# Patient Record
Sex: Male | Born: 1955
Health system: Southern US, Community
[De-identification: ages and names within clinical notes are randomized; demographics above are authoritative.]

## PROBLEM LIST (undated history)

## (undated) DIAGNOSIS — G809 Cerebral palsy, unspecified: Secondary | ICD-10-CM

## (undated) HISTORY — PX: COLONOSCOPY: SHX174

## (undated) HISTORY — PX: OTHER SURGICAL HISTORY: SHX169

---

## 2005-04-23 ENCOUNTER — Emergency Department (HOSPITAL_COMMUNITY): Admission: EM | Admit: 2005-04-23 | Discharge: 2005-04-23 | Payer: Self-pay | Admitting: *Deleted

## 2005-04-24 ENCOUNTER — Ambulatory Visit (HOSPITAL_COMMUNITY): Admission: RE | Admit: 2005-04-24 | Discharge: 2005-04-24 | Payer: Self-pay | Admitting: *Deleted

## 2008-07-11 ENCOUNTER — Ambulatory Visit (HOSPITAL_COMMUNITY): Admission: RE | Admit: 2008-07-11 | Discharge: 2008-07-11 | Payer: Self-pay | Admitting: General Surgery

## 2011-04-01 NOTE — H&P (Signed)
NAME:  Peter Levy, Peter Levy NO.:  [id number]   MEDICAL RECORD NO.:  [id number]          PATIENT TYPE:  AMB   LOCATION:  DAY                           FACILITY:  [facility]   PHYSICIAN:  Dalia Heading, M.D.  DATE OF BIRTH:  08-26-1956   DATE OF ADMISSION:  DATE OF DISCHARGE:  LH                              HISTORY & PHYSICAL   CHIEF COMPLAINT:  Need for screening colonoscopy.   HISTORY OF PRESENT ILLNESS:  The patient is a 55 year old black male who  was referred for endoscopic evaluation.  He needs colonoscopy for  screening herpes.  No abdominal pain, weight loss, nausea, vomiting,  diarrhea, constipation, melena, and hematochezia have been noted.  He  has never had a colonoscopy.   FAMILY HISTORY:  He has no family history of colon carcinoma.   PAST MEDICAL HISTORY:  Unremarkable.   PAST SURGICAL HISTORY:  Unremarkable.   CURRENT MEDICATIONS:  None.   ALLERGIES:  No known drug allergies.   REVIEW OF SYSTEMS:  Noncontributory.   On physical examination, the patient is well-developed well-nourished  black male in no acute distress.  His lungs clear to auscultation with  good breath sounds bilaterally.  Heart examination reveals regular rate  and rhythm without S3, S4, murmurs.  The abdomen is soft, nontender, and  nondistended.  No hepatosplenomegaly or masses were noted.  Rectal  examination was deferred at the procedure.   IMPRESSION:  Need for screening colonoscopy.   PLAN:  The patient is scheduled for a colonoscopy on July 11, 2008.  The risk and benefits of the procedure including bleeding and  perforation were fully explained to the patient, gave informed consent.      Dalia Heading, M.D.  Electronically Signed     MAJ/MEDQ  D:  07/06/2008  T:  07/07/2008  Job:  [id number]   cc:   Kirk Ruths, M.D.  Fax: [phone]

## 2011-11-04 ENCOUNTER — Other Ambulatory Visit: Payer: Self-pay

## 2011-11-04 ENCOUNTER — Emergency Department (HOSPITAL_COMMUNITY): Payer: Medicare Other

## 2011-11-04 ENCOUNTER — Emergency Department (HOSPITAL_COMMUNITY)
Admission: EM | Admit: 2011-11-04 | Discharge: 2011-11-04 | Disposition: A | Discharge status: Home / Self Care | Payer: Medicare Other | Attending: Emergency Medicine | Admitting: Emergency Medicine

## 2011-11-04 DIAGNOSIS — G809 Cerebral palsy, unspecified: Secondary | ICD-10-CM | POA: Insufficient documentation

## 2011-11-04 DIAGNOSIS — R6889 Other general symptoms and signs: Secondary | ICD-10-CM | POA: Insufficient documentation

## 2011-11-04 DIAGNOSIS — J069 Acute upper respiratory infection, unspecified: Secondary | ICD-10-CM

## 2011-11-04 DIAGNOSIS — R059 Cough, unspecified: Secondary | ICD-10-CM | POA: Insufficient documentation

## 2011-11-04 DIAGNOSIS — R05 Cough: Secondary | ICD-10-CM | POA: Insufficient documentation

## 2011-11-04 HISTORY — DX: Cerebral palsy, unspecified: G80.9

## 2011-11-04 LAB — CBC
HCT: 41.2 % (ref 39.0–52.0)
Hemoglobin: 13.6 g/dL (ref 13.0–17.0)
MCH: 30 pg (ref 26.0–34.0)
MCV: 90.7 fL (ref 78.0–100.0)
Platelets: 207 10*3/uL (ref 150–400)

## 2011-11-04 LAB — BASIC METABOLIC PANEL
BUN: 15 mg/dL (ref 6–23)
CO2: 28 mEq/L (ref 19–32)
Calcium: 9.6 mg/dL (ref 8.4–10.5)
Chloride: 102 mEq/L (ref 96–112)
Creatinine, Ser: 1.18 mg/dL (ref 0.50–1.35)
GFR calc Af Amer: 78 mL/min — ABNORMAL LOW (ref >90)
GFR calc non Af Amer: 67 mL/min — ABNORMAL LOW (ref >90)
Glucose, Bld: 100 mg/dL — ABNORMAL HIGH (ref 70–99)

## 2011-11-04 LAB — URINALYSIS, ROUTINE W REFLEX MICROSCOPIC
Bilirubin Urine: NEGATIVE
Glucose, UA: NEGATIVE mg/dL
Hgb urine dipstick: NEGATIVE
Ketones, ur: NEGATIVE mg/dL
Leukocytes, UA: NEGATIVE
Nitrite: NEGATIVE
Protein, ur: NEGATIVE mg/dL
Specific Gravity, Urine: 1.025 (ref 1.005–1.030)
Urobilinogen, UA: 0.2 mg/dL (ref 0.0–1.0)
pH: 6.5 (ref 5.0–8.0)

## 2011-11-04 LAB — DIFFERENTIAL
Eosinophils Absolute: 0.1 10*3/uL (ref 0.0–0.7)
Lymphocytes Relative: 20 % (ref 12–46)
Lymphs Abs: 0.9 10*3/uL (ref 0.7–4.0)
Monocytes Relative: 12 % (ref 3–12)
Neutro Abs: 3 10*3/uL (ref 1.7–7.7)

## 2011-11-04 LAB — TROPONIN I: Troponin I: <0.3 ng/mL

## 2011-11-04 MED ORDER — ACETAMINOPHEN 325 MG PO TABS: 650.0000 mg | ORAL_TABLET | Freq: Four times a day (QID) | ORAL | Status: AC | PRN

## 2011-11-04 MED ORDER — ACETAMINOPHEN 500 MG PO TABS
1000.0000 mg | ORAL_TABLET | Freq: Once | ORAL | Status: AC
  Administered 2011-11-04: 1000 mg via ORAL
  Filled 2011-11-04 (×2): qty 2

## 2011-11-04 NOTE — ED Provider Notes (Signed)
History     CSN: [id number] Arrival date & time: 11/04/2011  4:32 PM   Chief Complaint  Patient presents with  . Cough    Patient is a 54 y.o. male presenting with cough. The history is provided by a caregiver. The history is limited by a developmental delay.  Cough  Pt was seen at 1730.  Per pt's caregiver, c/o gradual onset and persistence of constant cough since last night.  Cough has been assoc with vague chest "pains" since this morning.  Adult day program told caregiver pt "felt warm" today, unk fevers previous to today.  Pt himself denies any other complaints than "cough."  +multiple sick contacts at adult daycare site and group home with similar symptoms.  No reported N/V/D, no rash, no SOB.   Past Medical History  Diagnosis Date  . CP (cerebral palsy)     History reviewed. No pertinent past surgical history.   History  Substance Use Topics  . Smoking status: Never Smoker   . Smokeless tobacco: Not on file  . Alcohol Use: No    Review of Systems  Unable to perform ROS: Other  Respiratory: Positive for cough.     Allergies  Review of patient's allergies indicates no known allergies.  Home Medications  No current outpatient prescriptions on file.  BP 120/65  Pulse 107  Temp(Src) 102.6 F (39.2 C) (Oral)  Resp 20  Wt 154 lb (69.854 kg)  SpO2 96%  Physical Exam 1735: Physical examination:  Nursing notes reviewed; Vital signs and O2 SAT reviewed;  Constitutional: Well developed, Well nourished, Well hydrated, In no acute distress; Head:  Normocephalic, atraumatic; Eyes: EOMI, PERRL, No scleral icterus; ENMT: Mouth and pharynx normal, Mucous membranes moist, +edemetous nasal turbinates bilat with clear rhinorrhea.; Neck: Supple, Full range of motion, No lymphadenopathy; Cardiovascular: Tachycardic rate and regular rhythm, No murmur or gallop; Respiratory: Breath sounds clear & equal bilaterally, No rales, rhonchi, wheezes, or rub, Normal respiratory  effort/excursion; Chest: Nontender, Movement normal; Abdomen: Soft, Nontender, Nondistended, Normal bowel sounds; Extremities: Pulses normal, No tenderness, No edema, No calf edema or asymmetry.; Neuro: Awake/alert, eyes open spontaneously, speech clear, confused re: events, Major CN grossly intact.  No gross focal motor or sensory deficits in extremities.; Skin: Color normal, Warm, Dry, no rash.    ED Course  Procedures    MDM  MDM Reviewed: nursing note and vitals Interpretation: labs, x-ray and ECG    Date: 11/04/2011  Rate: 99  Rhythm: normal sinus rhythm and premature ventricular contractions (PVC)  QRS Axis: normal  Intervals: normal  ST/T Wave abnormalities: normal  Conduction Disutrbances:none  Narrative Interpretation:   Old EKG Reviewed: none available.  Results for orders placed during the hospital encounter of 11/04/11  CBC      Component Value Range   WBC 4.6  4.0 - 10.5 (K/uL)   RBC 4.54  4.22 - 5.81 (MIL/uL)   Hemoglobin 13.6  13.0 - 17.0 (g/dL)   HCT 40.9  81.1 - 91.4 (%)   MCV 90.7  78.0 - 100.0 (fL)   MCH 30.0  26.0 - 34.0 (pg)   MCHC 33.0  30.0 - 36.0 (g/dL)   RDW 78.2  95.6 - 21.3 (%)   Platelets 207  150 - 400 (K/uL)  DIFFERENTIAL      Component Value Range   Neutrophils Relative 66  43 - 77 (%)   Neutro Abs 3.0  1.7 - 7.7 (K/uL)   Lymphocytes Relative 20  12 - 46 (%)  Lymphs Abs 0.9  0.7 - 4.0 (K/uL)   Monocytes Relative 12  3 - 12 (%)   Monocytes Absolute 0.6  0.1 - 1.0 (K/uL)   Eosinophils Relative 2  0 - 5 (%)   Eosinophils Absolute 0.1  0.0 - 0.7 (K/uL)   Basophils Relative 0  0 - 1 (%)   Basophils Absolute 0.0  0.0 - 0.1 (K/uL)  BASIC METABOLIC PANEL      Component Value Range   Sodium 137  135 - 145 (mEq/L)   Potassium 3.9  3.5 - 5.1 (mEq/L)   Chloride 102  96 - 112 (mEq/L)   CO2 28  19 - 32 (mEq/L)   Glucose, Bld 100 (*) 70 - 99 (mg/dL)   BUN 15  6 - 23 (mg/dL)   Creatinine, Ser 1.61  0.50 - 1.35 (mg/dL)   Calcium 9.6  8.4 - 09.6  (mg/dL)   GFR calc non Af Amer 67 (*) >90 (mL/min)   GFR calc Af Amer 78 (*) >90 (mL/min)  URINALYSIS, ROUTINE W REFLEX MICROSCOPIC      Component Value Range   Color, Urine YELLOW  YELLOW    APPearance CLEAR  CLEAR    Specific Gravity, Urine 1.025  1.005 - 1.030    pH 6.5  5.0 - 8.0    Glucose, UA NEGATIVE  NEGATIVE (mg/dL)   Hgb urine dipstick NEGATIVE  NEGATIVE    Bilirubin Urine NEGATIVE  NEGATIVE    Ketones, ur NEGATIVE  NEGATIVE (mg/dL)   Protein, ur NEGATIVE  NEGATIVE (mg/dL)   Urobilinogen, UA 0.2  0.0 - 1.0 (mg/dL)   Nitrite NEGATIVE  NEGATIVE    Leukocytes, UA NEGATIVE  NEGATIVE   TROPONIN I      Component Value Range   Troponin I <0.30  <0.30 (ng/mL)   Dg Chest 2 View  11/04/2011  *RADIOLOGY REPORT*  Clinical Data: Fever.  Cough.  Congestion.  Cerebral palsy.  CHEST - 2 VIEW  Comparison: None.  Findings: Lateral view degraded by patient arm position.  Midline trachea.  Normal heart size and mediastinal contours. No pleural effusion or pneumothorax.  Clear lungs.  Mild left hemidiaphragm elevation.  IMPRESSION: No acute cardiopulmonary disease.  Original Report Authenticated By: Consuello Bossier, M.D.    8:46 PM:   Pt smiling, eating sandwich and drinking soda.  NAD, resps easy.  Fever improved with APAP.   Appears viral illness at this time, will tx symptomatically.  Dx testing d/w pt's caregiver.  Questions answered.  Verb understanding, agreeable to d/c home with outpt f/u.          Peter Levy Peter Quarry, DO 11/05/11 1202

## 2011-11-04 NOTE — ED Notes (Signed)
Pt reports cough since last night and sudden onset of cp today.  Pain worse with coughing per ems.

## 2011-11-04 NOTE — ED Notes (Signed)
Patient medicated for fever with 1 gram of Tylenol.

## 2011-11-05 LAB — URINE CULTURE
Colony Count: NO GROWTH
Culture  Setup Time: 201212190219
Culture: NO GROWTH

## 2012-05-14 DIAGNOSIS — G803 Athetoid cerebral palsy: Secondary | ICD-10-CM | POA: Diagnosis not present

## 2012-05-14 DIAGNOSIS — F73 Profound intellectual disabilities: Secondary | ICD-10-CM | POA: Diagnosis not present

## 2012-05-14 DIAGNOSIS — Z6832 Body mass index (BMI) 32.0-32.9, adult: Secondary | ICD-10-CM | POA: Diagnosis not present

## 2012-05-14 DIAGNOSIS — Z23 Encounter for immunization: Secondary | ICD-10-CM | POA: Diagnosis not present

## 2012-08-26 DIAGNOSIS — Z23 Encounter for immunization: Secondary | ICD-10-CM | POA: Diagnosis not present

## 2013-05-03 DIAGNOSIS — G809 Cerebral palsy, unspecified: Secondary | ICD-10-CM | POA: Diagnosis not present

## 2013-05-03 DIAGNOSIS — Z125 Encounter for screening for malignant neoplasm of prostate: Secondary | ICD-10-CM | POA: Diagnosis not present

## 2013-05-03 DIAGNOSIS — Z79899 Other long term (current) drug therapy: Secondary | ICD-10-CM | POA: Diagnosis not present

## 2013-05-03 DIAGNOSIS — Z7182 Exercise counseling: Secondary | ICD-10-CM | POA: Diagnosis not present

## 2013-05-03 DIAGNOSIS — Z681 Body mass index (BMI) 19 or less, adult: Secondary | ICD-10-CM | POA: Diagnosis not present

## 2013-05-03 DIAGNOSIS — Z713 Dietary counseling and surveillance: Secondary | ICD-10-CM | POA: Diagnosis not present

## 2013-05-03 DIAGNOSIS — A938 Other specified arthropod-borne viral fevers: Secondary | ICD-10-CM | POA: Diagnosis not present

## 2013-05-03 DIAGNOSIS — E669 Obesity, unspecified: Secondary | ICD-10-CM | POA: Diagnosis not present

## 2013-08-17 DIAGNOSIS — Z23 Encounter for immunization: Secondary | ICD-10-CM | POA: Diagnosis not present

## 2014-05-05 DIAGNOSIS — Z0000 Encounter for general adult medical examination without abnormal findings: Secondary | ICD-10-CM | POA: Diagnosis not present

## 2014-05-05 DIAGNOSIS — R972 Elevated prostate specific antigen [PSA]: Secondary | ICD-10-CM | POA: Diagnosis not present

## 2014-05-05 DIAGNOSIS — Z6830 Body mass index (BMI) 30.0-30.9, adult: Secondary | ICD-10-CM | POA: Diagnosis not present

## 2014-05-05 DIAGNOSIS — D72818 Other decreased white blood cell count: Secondary | ICD-10-CM | POA: Diagnosis not present

## 2014-08-21 DIAGNOSIS — Z23 Encounter for immunization: Secondary | ICD-10-CM | POA: Diagnosis not present

## 2015-03-04 ENCOUNTER — Emergency Department (HOSPITAL_COMMUNITY): Payer: Medicare Other

## 2015-03-04 ENCOUNTER — Encounter (HOSPITAL_COMMUNITY): Payer: Self-pay | Admitting: Emergency Medicine

## 2015-03-04 ENCOUNTER — Emergency Department (HOSPITAL_COMMUNITY)
Admission: EM | Admit: 2015-03-04 | Discharge: 2015-03-04 | Disposition: A | Discharge status: Home / Self Care | Payer: Medicare Other | Attending: Emergency Medicine | Admitting: Emergency Medicine

## 2015-03-04 DIAGNOSIS — M542 Cervicalgia: Secondary | ICD-10-CM

## 2015-03-04 DIAGNOSIS — M5032 Other cervical disc degeneration, mid-cervical region: Secondary | ICD-10-CM | POA: Diagnosis not present

## 2015-03-04 DIAGNOSIS — M62838 Other muscle spasm: Secondary | ICD-10-CM

## 2015-03-04 MED ORDER — METHOCARBAMOL 500 MG PO TABS: 500.0000 mg | ORAL_TABLET | Freq: Two times a day (BID) | ORAL | Status: DC | PRN

## 2015-03-04 MED ORDER — DIAZEPAM 2 MG PO TABS
2.0000 mg | ORAL_TABLET | Freq: Once | ORAL | Status: AC
  Administered 2015-03-04: 2 mg via ORAL
  Filled 2015-03-04: qty 1

## 2015-03-04 NOTE — Discharge Instructions (Signed)
°Emergency Department Resource Guide °1) Find a Doctor and Pay Out of Pocket °Although you won't have to find out who is covered by your insurance plan, it is a good idea to ask around and get recommendations. You will then need to call the office and see if the doctor you have chosen will accept you as a new patient and what types of options they offer for patients who are self-pay. Some doctors offer discounts or will set up payment plans for their patients who do not have insurance, but you will need to ask so you aren't surprised when you get to your appointment. ° °2) Contact Your Local Health Department °Not all health departments have doctors that can see patients for sick visits, but many do, so it is worth a call to see if yours does. If you don't know where your local health department is, you can check in your phone book. The CDC also has a tool to help you locate your state's health department, and many state websites also have listings of all of their local health departments. ° °3) Find a Walk-in Clinic °If your illness is not likely to be very severe or complicated, you may want to try a walk in clinic. These are popping up all over the country in pharmacies, drugstores, and shopping centers. They're usually staffed by nurse practitioners or physician assistants that have been trained to treat common illnesses and complaints. They're usually fairly quick and inexpensive. However, if you have serious medical issues or chronic medical problems, these are probably not your best option. ° °No Primary Care Doctor: °- Call Health Connect at  832-8000 - they can help you locate a primary care doctor that  accepts your insurance, provides certain services, etc. °- Physician Referral Service- 1-800-533-3463 ° °Chronic Pain Problems: °Organization         Address  Phone   Notes  °[facility]  (336) [phone] Patients need to be referred by their primary care doctor.  ° °Medication  Assistance: °Organization         Address  Phone   Notes  °[place] [facility] [address] °[place], [place] [address] (336) 641-8030 --Must be a resident of [place] °-- Must have NO insurance coverage whatsoever (no Medicaid/ Medicare, etc.) °-- The pt. MUST have a primary care doctor that directs their care regularly and follows them in the community °  °[facility]  (866) 331-1348   °[employer]  (888) 892-1162   ° °Agencies that provide inexpensive medical care: °Organization         Address  Phone   Notes  °[facility]  (336) 832-8035   °[facility]    (336) 832-7272   °[facility] [address] °[place], [place] [address] (336) 832-4777   °[facility] [place] [facility] [address], °[place] (336) 271-4999   °[facility]    (336) 373-0678   °[facility]    (336) 272-1050   °[facility] ° [address], [place] Phone:  (336) 832-4444, Fax:  (336) 832-4440 Hours of Operation:  9 am - 6 pm, M-F.  Also accepts Medicaid/Medicare and self-pay.  °[facility] ° [address], [place] Phone: (336) [phone], Fax: (336) [phone]. Hours of Operation:  8:30 am - 5:30 pm, M-F.  Also accepts Medicaid and self-pay.  °[facility] [place] [address], [place] Phone: (336) 878-6027   °[facility] [address], [place], [place] (336)723-1848, Ext. 123 Mondays & Thursdays: 7-9 AM.  First 15 patients are seen on a first come, first serve basis. °  ° °Medicaid-accepting [place] Providers: ° °Organization         Address  Phone   Notes  °[facility] [address], [place] (336) 641-2100 Also accepts self-pay patients.  °[facility] [address], [place] ° (336) 856-9996   °[facility] [address], [place]  (336) 288-8857   °[facility] [address] [place] [address], [place] (336) 299-7000   °Veita Bland [address], [place]  ° (336) 373-1557 Only accepts [place] Access Medicaid patients after they have their name applied to their card.  ° °Self-Pay (no insurance) in [place]: ° °Organization         Address  Phone   Notes  °Sickle Cell Patients, [facility] [address], [place] (336) 832-1970   °[facility] [address], [place] (336) 832-4400   °[facility] ° [address], [place] (336) 992-4800   °[facility]/Dr. Osei-Bonsu ° [address] [place] [address], [place] or [address], [place] (336) [phone] Phone number for both [place] and [place] locations is the same.  °[facility] [address], [place] (336) 299-0000   °[facility] [address] [place] [address], [place] or [address] (336) 852-7530 °(336) 878-2260   °[facility] [address], [place] (336) 350-1642, phone; (336) 294-5005, fax Sees patients 1st and 3rd Saturday of every month.  Must not qualify for public or private insurance (i.e. Medicaid, Medicare, [place] Health Choice, Veterans' Benefits) • Household income should be no more than 200% of the poverty level •The clinic cannot treat you if you are pregnant or think you are pregnant • Sexually transmitted diseases are not treated at the clinic.  ° ° °Dental Care: °Organization         Address  Phone  Notes  °[place] [facility] [address], [place] (336) 641-6152 Accepts children up to age 21 who are enrolled in Medicaid or [place] Health Choice; pregnant women with a Medicaid card; and children who have applied for Medicaid or [place] Health Choice, but were declined, whose parents can pay a reduced fee at time of service.  °[place]  [facility] [place]  [address], [place] (336) 641-7733 Accepts children up to age 21 who are enrolled in Medicaid or [place] Health Choice; pregnant women with a Medicaid card; and children who have applied for Medicaid or [place] Health Choice, but were declined, whose parents can pay a reduced fee at time of service.  °[facility] ° [address], [place] (336) 641-4533 Patients are seen by appointment only. Walk-ins are not accepted. [facility] will see patients 18 years of age and older. °Monday - Tuesday (8am-5pm) °Most Wednesdays (8:30-5pm) °$30 per visit, cash only  °[facility] ° [address], [place] (336) 641-4533 Patients are seen by appointment only. Walk-ins are not accepted. [facility] will see patients 18 years of age and older. °One   Wednesday Evening (Monthly: Volunteer Based).  $30 per visit, cash only  °[facility]  (919) [phone] for adults; Children under age 4, call Graduate Pediatric Dentistry at ([phone]) [phone]. Children aged 4-14, please call ([phone]) [phone] to request a pediatric application. ° Dental services are provided in all areas of dental care including fillings, crowns and bridges, complete and partial dentures, implants, gum treatment, root canals, and extractions. Preventive care is also provided. Treatment is provided to both adults and children. °Patients are selected via a lottery and there is often a waiting list. °  °[facility] [address], °[place] ° (336) 763-8833 [email or web address] °  °[facility] [address], [place], [place] (336)723-1848, Ext. 123 Second and Fourth Thursday of each month, opens at 6:30 AM; Clinic ends at 9 AM.  Patients are seen on a first-come first-served basis, and a limited number are seen during each clinic.  ° °[facility] ° [address], [place], [place] (336) 723-7904    Eligibility Requirements °You must have lived in [place], [place], or [place] counties for at least the last three months. °  You cannot be eligible for state or federal sponsored healthcare insurance, including Veterans Administration, Medicaid, or Medicare. °  You generally cannot be eligible for healthcare insurance through your employer.  °  How to apply: °Eligibility screenings are held every Tuesday and Wednesday afternoon from 1:00 pm until 4:00 pm. You do not need an appointment for the interview!  °[facility] [address], [place], [place] 336-631-2330   °[facility]  336-342-8273   °[place]  336-703-3100   °[facility]  336-570-6415   ° °Behavioral Health Resources in the Community: °Intensive Outpatient Programs °Organization         Address  Phone  Notes  °[facility] [address], [place], [place] 336-878-6098   °[facility] [address], [place], [place] 336-832-9800   °[facility] [address], [place], [place] ° 336-882-2125   °[facility] [address],  °[place], [place] 1-800-853-5163 or 336-641-4981   °Substance Abuse Resources °Organization         Address  Phone  Notes  °[facility]  336-882-2125   °[facility]  336-784-9470   °[facility]  336-285-9073   °[facility]  336-845-3988   °Residential & Outpatient Substance Abuse Program  1-800-659-3381   °Psychological Services °Organization         Address  Phone  Notes  °[facility]  336- 832-9600   °[facility]  336- 378-7881   °[facility] [address], [place] 1-800-853-5163 or 336-641-4981   ° °Mobile Crisis Teams °Organization         Address  Phone  Notes  °[facility], Mobile Crisis Care Unit  1-877-626-1772   °Assertive °[facility] ° [address]  [place], [place] 336-834-9664   °Sharon DeEsch [address] °[place] 336-554-5454   ° °Self-Help/Support Groups °Organization         Address  Phone             Notes  °[facility] of  - variety of support groups  336- 373-1402 Call for more information  °Narcotics Anonymous (NA), [facility] [address], °[place]  2 meetings at this location  ° °  Residential Treatment Programs Organization         Address  Phone  Notes  [facility] [address],    [place]  [phone]   [facility]  [address], [place] [phone], [place], [place] [phone]   [facility] [address], [place] [phone] Admissions: 8am-3pm M-F  [facility] [address]    [place], [place] [phone]   [facility] [address], [place], [place] [phone]   [facility] [address]  [place], [place] [phone]   [facility] Intensive Outpatient [address], [place], [place] [phone]   [facility] [address]  [place], [place] [phone] or [phone]   [facility] [address] [place], [place] [phone] Accepts Medicaid  [facility] [address]  [place] [phone] Substance Abuse/Addiction Treatment   [facility] Organization         Address  Phone  Notes  [facility]  [phone](888) [phone]   Angie FavaJulie Brannon, PhD [address], [address] [place], [place]   [phone](336) [phone] or [phone](336) [phone]   [facility]   [address] [place], [place] ([phone](336) [phone]   [facility] [address], [place], [place] [phone](336) [phone] Insurance/Medicaid/sponsorship through [facility] [address]                                    [place], [place] [phone](336) [phone] Therapy/tele-psych/case    [facility] [address]   [place], [place] [phone]) [phone]    Dr. Lolly MustacheArfeen  [phone](336) [phone]   [facility] [place]  [employer] [facility] 1) [address], [place] 2) [address], [place] 3)  [address], [place] [phone](336) [phone](336) [phone]  [phone](336) [phone]   [facility] Child Abuse Hotline 252-249-3938(336) 212-721-7251 or [phone](336) [phone] (After Hours)      Take the prescription as directed. May also take over the counter tylenol, as directed on packaging, as needed for discomfort. Apply moist heat or ice to the area(s) of discomfort, for 15 minutes at a time, several times per day for the next few days.  Do not fall asleep on a heating or ice pack.  Call your regular medical doctor tomorrow to schedule a follow up appointment in the next 2 to 3 days.  Return to the Emergency Department immediately if worsening.

## 2015-03-04 NOTE — ED Provider Notes (Signed)
CSN: [id number]     Arrival date & time 03/04/15  [id number] History   First MD Initiated Contact with Patient 03/04/15 [phone]     Chief Complaint  Patient presents with  . Neck Pain      HPI Pt was seen at 0710.  Per pt, c/o gradual onset and persistence of left sided neck "pain" that began "a while ago." Pt's caregiver states she "just noticed it." Pt describes the pain as "a cramp in my muscle." Denies injury, no focal motor weakness, no tingling/numbness in extremities, no CP/SOB, no fevers, no rash.    Past Medical History  Diagnosis Date  . CP (cerebral palsy)    History reviewed. No pertinent past surgical history.  History  Substance Use Topics  . Smoking status: Never Smoker   . Smokeless tobacco: Not on file  . Alcohol Use: No    Review of Systems ROS: Statement: All systems negative except as marked or noted in the HPI; Constitutional: Negative for fever and chills. ; ; Eyes: Negative for eye pain, redness and discharge. ; ; ENMT: Negative for ear pain, hoarseness, nasal congestion, sinus pressure and sore throat. ; ; Cardiovascular: Negative for chest pain, palpitations, diaphoresis, dyspnea and peripheral edema. ; ; Respiratory: Negative for cough, wheezing and stridor. ; ; Gastrointestinal: Negative for nausea, vomiting, diarrhea, abdominal pain, blood in stool, hematemesis, jaundice and rectal bleeding. . ; ; Genitourinary: Negative for dysuria, flank pain and hematuria. ; ; Musculoskeletal: +left neck pain. Negative for back pain. Negative for swelling and trauma.; ; Skin: Negative for pruritus, rash, abrasions, blisters, bruising and skin lesion.; ; Neuro: Negative for headache, lightheadedness and neck stiffness. Negative for weakness, altered level of consciousness , altered mental status, extremity weakness, paresthesias, involuntary movement, seizure and syncope.      Allergies  Review of patient's allergies indicates no known allergies.  Home Medications   Prior to  Admission medications   Not on File   BP 132/92 mmHg  Pulse 125  Temp(Src) 98.4 F (36.9 C) (Oral)  Resp 18  Ht 5\' 4"  (1.626 m)  Wt 184 lb (83.462 kg)  BMI 31.57 kg/m2  SpO2 98% Physical Exam  0715: Physical examination:  Nursing notes reviewed; Vital signs and O2 SAT reviewed;  Constitutional: Well developed, Well nourished, Well hydrated, In no acute distress; Head:  Normocephalic, atraumatic; Eyes: EOMI, PERRL, No scleral icterus; ENMT: Mouth and pharynx normal, Mucous membranes moist; Neck: Supple, Full range of motion, No lymphadenopathy; Cardiovascular: Regular rate and rhythm, No gallop; Respiratory: Breath sounds clear & equal bilaterally, No wheezes.  Speaking full sentences with ease, Normal respiratory effort/excursion; Chest: Nontender, Movement normal; Abdomen: Soft, Nontender, Nondistended, Normal bowel sounds; Genitourinary: No CVA tenderness; Spine:  No midline CS, TS, LS tenderness. +TTP left hypertonic trapezius muscle. No rash.;; Extremities: Pulses normal, No tenderness, No edema, No calf edema or asymmetry.; Neuro: AA&Ox3, Major CN grossly intact.  Speech clear. +hx CP, moves all extremities spontaneously and to command per his baseline..; Skin: Color normal, Warm, Dry.   ED Course  Procedures     EKG Interpretation None      MDM  MDM Reviewed: previous chart, nursing note and vitals Interpretation: CT scan   Ct Cervical Spine Wo Contrast 03/04/2015   CLINICAL DATA:  Left side neck pain, no known injury. Caregiver said pain began this am. Pt has cerebral palsy. Unable to straighten patient's head, pt had difficulty controlling movements during exam  EXAM: CT CERVICAL SPINE WITHOUT CONTRAST  TECHNIQUE: Multidetector CT imaging of the cervical spine was performed without intravenous contrast. Multiplanar CT image reconstructions were also generated.  COMPARISON:  None.  FINDINGS: No fracture. No spondylolisthesis. Head is tilted to the right. There are degenerative  changes with loss of disc height endplate spurring most evident from C2-C3 through C5-C6. Uncovertebral spurring and some facet spurring causes neural foraminal narrowing. This is greatest on the left at C3-C4 where it is severe.  No evidence of a disc herniation.  Surrounding soft tissues are unremarkable.  Lung apices are clear.  IMPRESSION: 1. No fracture or acute findings. 2. Degenerative changes as described.   Electronically Signed   By: Amie Portland M.D.   On: 03/04/2015 07:55    2005:  CT CS with degenerative changes. Feels better after meds and wants to go home now. Tx symptomatically at this time. Dx and testing d/w pt and caregiver.  Questions answered.  Verb understanding, agreeable to d/c home with outpt f/u.   Samuel Jester, DO 03/05/15 2142

## 2015-03-04 NOTE — ED Notes (Signed)
Per pt he has been have neck pain for "a while" caretaker states she just noticed it about an hour ago. Pt denies any injury.

## 2015-03-07 DIAGNOSIS — G894 Chronic pain syndrome: Secondary | ICD-10-CM | POA: Diagnosis not present

## 2015-03-07 DIAGNOSIS — R252 Cramp and spasm: Secondary | ICD-10-CM | POA: Diagnosis not present

## 2015-03-07 DIAGNOSIS — E6609 Other obesity due to excess calories: Secondary | ICD-10-CM | POA: Diagnosis not present

## 2015-03-07 DIAGNOSIS — Z6830 Body mass index (BMI) 30.0-30.9, adult: Secondary | ICD-10-CM | POA: Diagnosis not present

## 2015-03-07 DIAGNOSIS — G809 Cerebral palsy, unspecified: Secondary | ICD-10-CM | POA: Diagnosis not present

## 2015-04-27 DIAGNOSIS — E6609 Other obesity due to excess calories: Secondary | ICD-10-CM | POA: Diagnosis not present

## 2015-04-27 DIAGNOSIS — E782 Mixed hyperlipidemia: Secondary | ICD-10-CM | POA: Diagnosis not present

## 2015-04-27 DIAGNOSIS — Z6830 Body mass index (BMI) 30.0-30.9, adult: Secondary | ICD-10-CM | POA: Diagnosis not present

## 2015-06-25 DIAGNOSIS — E782 Mixed hyperlipidemia: Secondary | ICD-10-CM | POA: Diagnosis not present

## 2015-06-25 DIAGNOSIS — Z6830 Body mass index (BMI) 30.0-30.9, adult: Secondary | ICD-10-CM | POA: Diagnosis not present

## 2015-06-25 DIAGNOSIS — E6609 Other obesity due to excess calories: Secondary | ICD-10-CM | POA: Diagnosis not present

## 2015-06-25 DIAGNOSIS — Z125 Encounter for screening for malignant neoplasm of prostate: Secondary | ICD-10-CM | POA: Diagnosis not present

## 2015-08-22 DIAGNOSIS — Z23 Encounter for immunization: Secondary | ICD-10-CM | POA: Diagnosis not present

## 2015-08-31 DIAGNOSIS — Z6830 Body mass index (BMI) 30.0-30.9, adult: Secondary | ICD-10-CM | POA: Diagnosis not present

## 2015-08-31 DIAGNOSIS — E6609 Other obesity due to excess calories: Secondary | ICD-10-CM | POA: Diagnosis not present

## 2015-08-31 DIAGNOSIS — Z1389 Encounter for screening for other disorder: Secondary | ICD-10-CM | POA: Diagnosis not present

## 2015-08-31 DIAGNOSIS — G809 Cerebral palsy, unspecified: Secondary | ICD-10-CM | POA: Diagnosis not present

## 2016-04-16 DIAGNOSIS — Z6828 Body mass index (BMI) 28.0-28.9, adult: Secondary | ICD-10-CM | POA: Diagnosis not present

## 2016-04-16 DIAGNOSIS — Z1389 Encounter for screening for other disorder: Secondary | ICD-10-CM | POA: Diagnosis not present

## 2016-04-16 DIAGNOSIS — G809 Cerebral palsy, unspecified: Secondary | ICD-10-CM | POA: Diagnosis not present

## 2016-04-16 DIAGNOSIS — E663 Overweight: Secondary | ICD-10-CM | POA: Diagnosis not present

## 2016-08-28 DIAGNOSIS — Z23 Encounter for immunization: Secondary | ICD-10-CM | POA: Diagnosis not present

## 2016-12-30 DIAGNOSIS — R4183 Borderline intellectual functioning: Secondary | ICD-10-CM | POA: Diagnosis not present

## 2017-01-01 DIAGNOSIS — R4183 Borderline intellectual functioning: Secondary | ICD-10-CM | POA: Diagnosis not present

## 2017-01-02 DIAGNOSIS — R4183 Borderline intellectual functioning: Secondary | ICD-10-CM | POA: Diagnosis not present

## 2017-01-12 IMAGING — CT CT CERVICAL SPINE W/O CM
3 of 4 series · 12 of 33 positions shown, 14 images · non-contrast
Comparison: None.

CLINICAL DATA: Left side neck pain, no known injury. Caregiver said
pain began this am. Pt has cerebral palsy. Unable to straighten
patient's head, pt had difficulty controlling movements during exam

EXAM:
CT CERVICAL SPINE WITHOUT CONTRAST
TECHNIQUE: Multidetector CT imaging of the cervical spine was performed without
intravenous contrast. Multiplanar CT image reconstructions were also
generated.

[Series 3: cervical 2.0 st axial · axial · 0.36mm/px · z∈[+93,+237]mm · 4 of 108 slices shown, 5 images]
[im 18/108  soft-tissue]
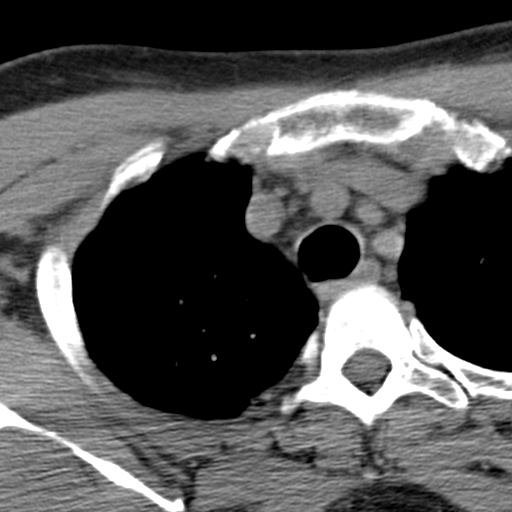
[im 18/108  bone]
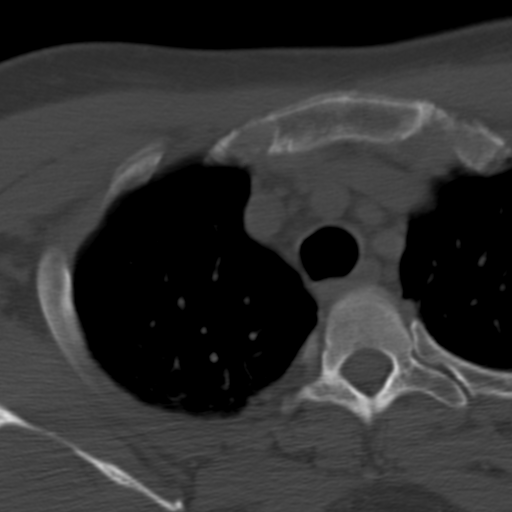
[im 36/108  bone]
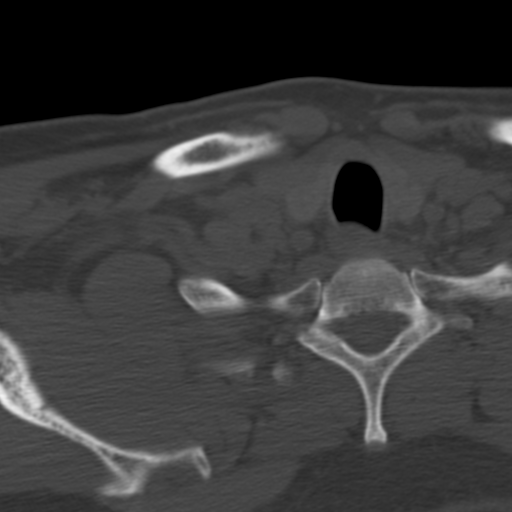
[im 72/108  bone]
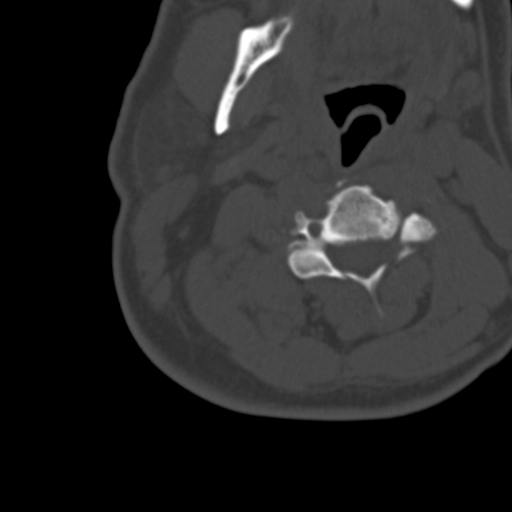
[im 90/108  bone]
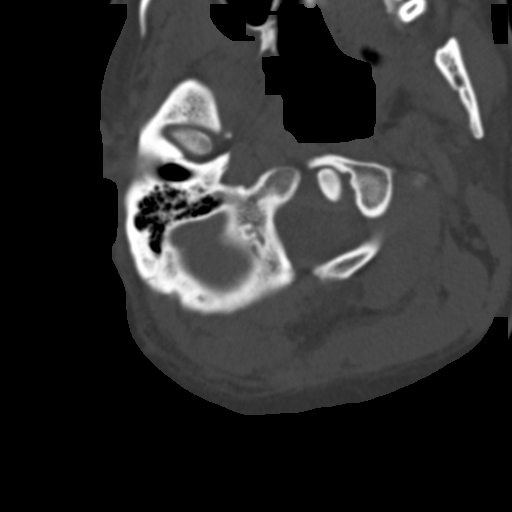

[Series 5: cervical spine sagittal bone · sagittal · 0.20mm/px · 5 of 65 slices shown, 6 images]
[im 22/65  bone]
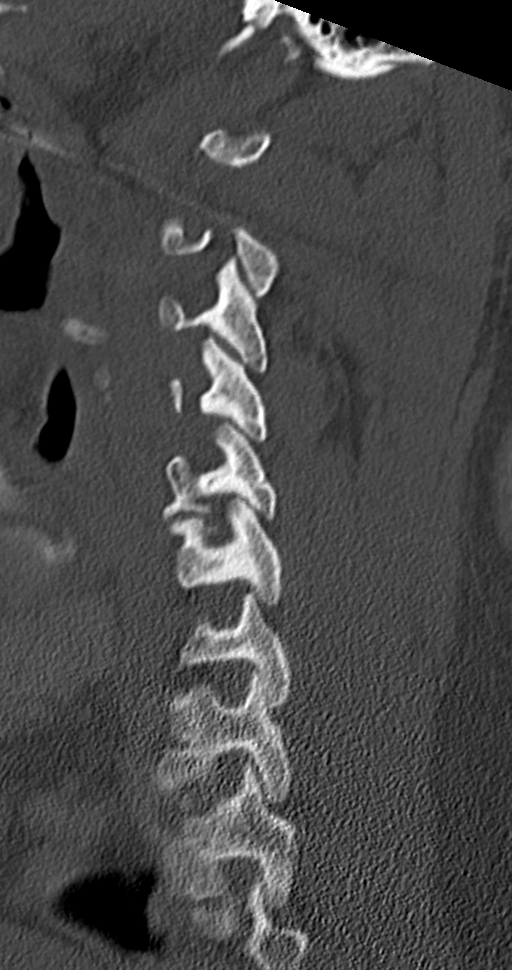
[im 27/65  bone]
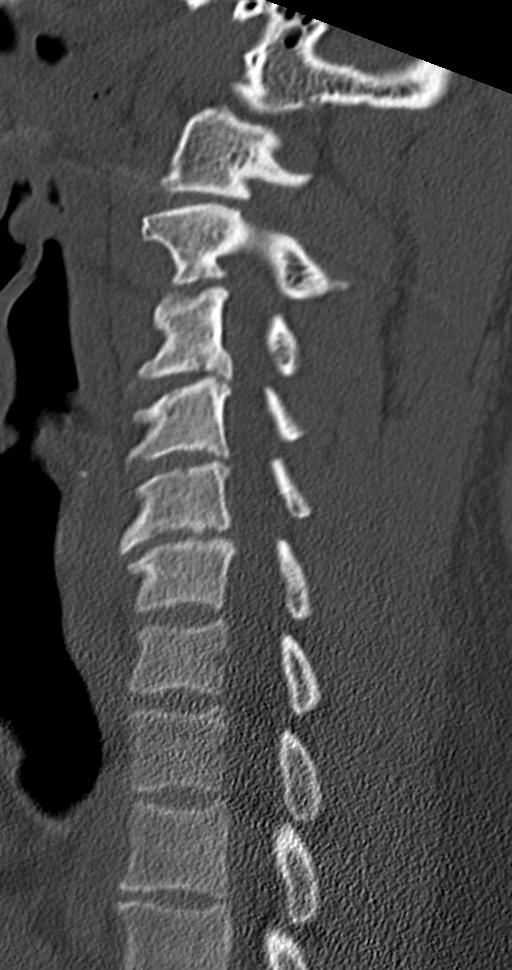
[im 33/65  soft-tissue]
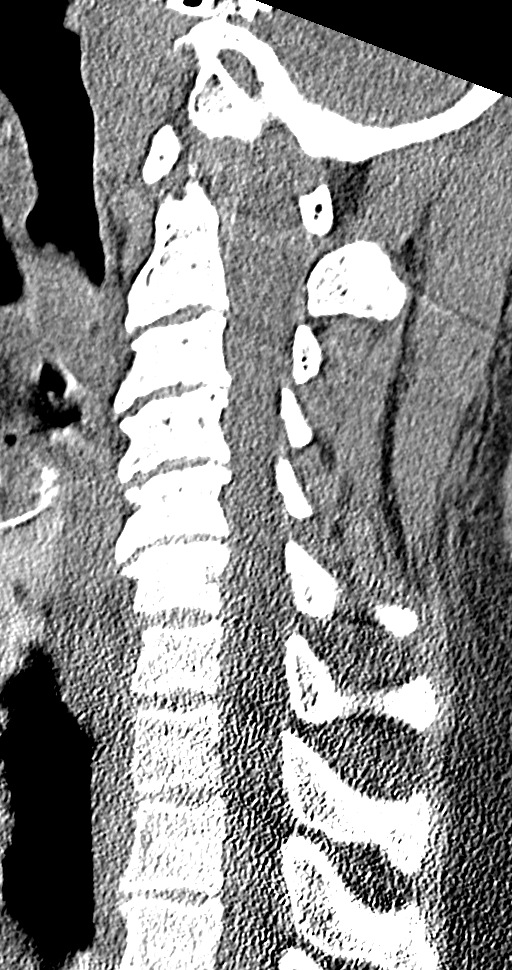
[im 33/65  bone]
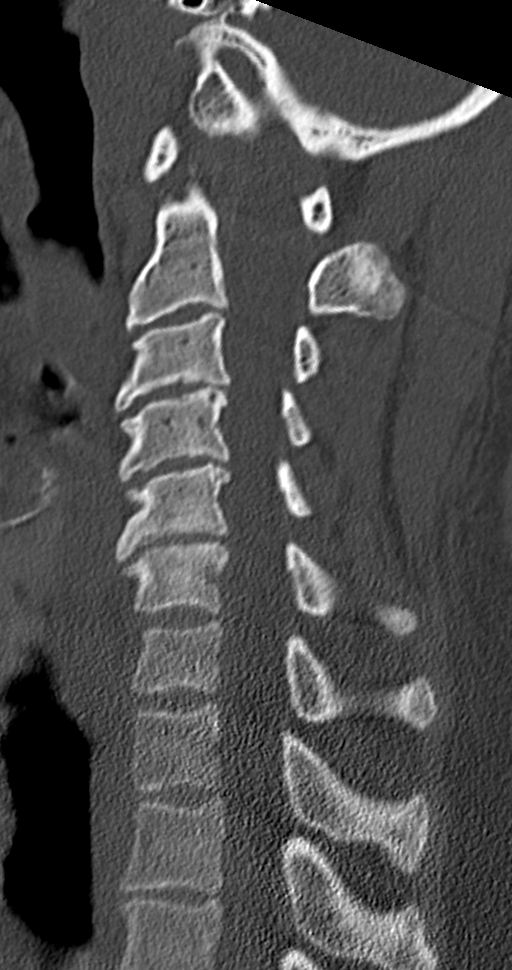
[im 38/65  bone]
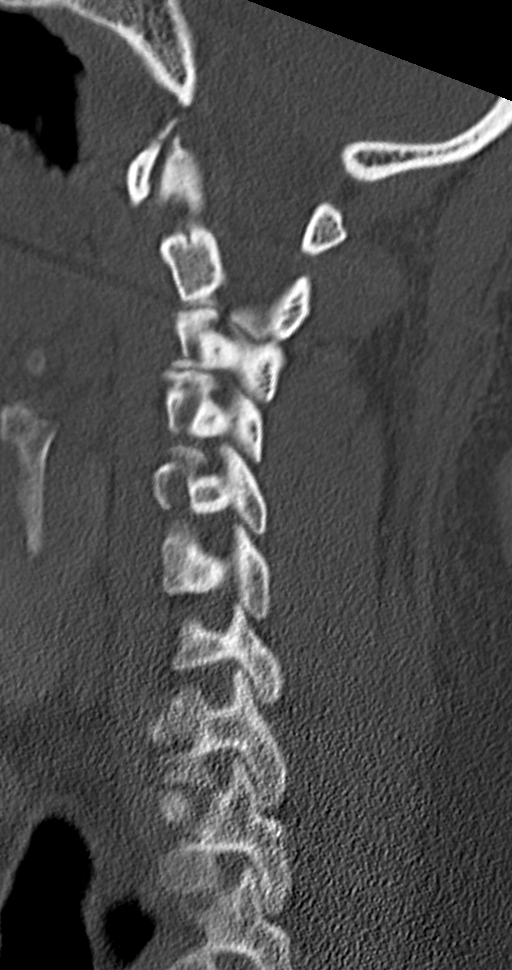
[im 43/65  bone]
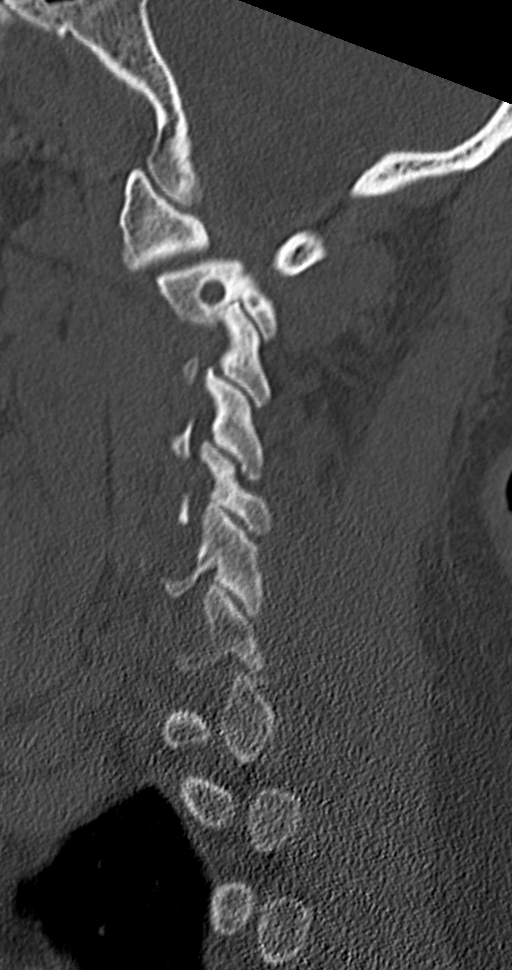

[Series 6: cervical spine coronal bone · coronal · 0.28mm/px · 3 of 46 slices shown]
[im 10/46  bone]
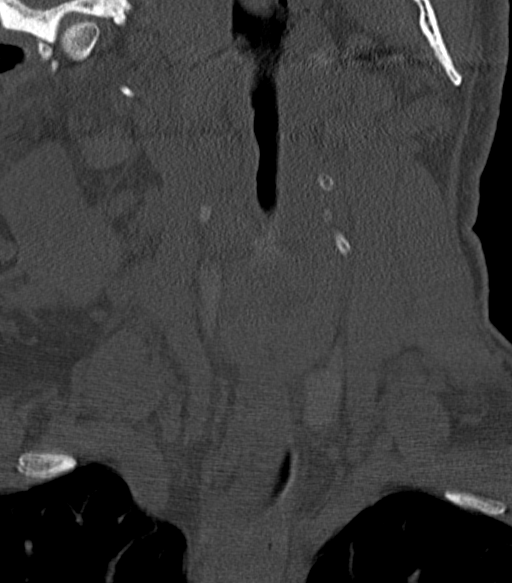
[im 19/46  bone]
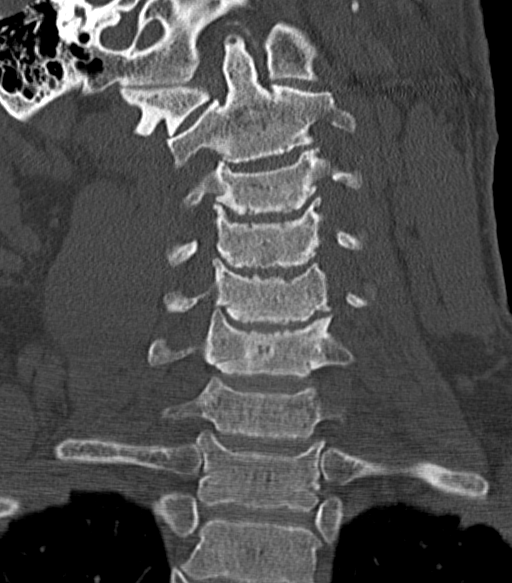
[im 28/46  bone]
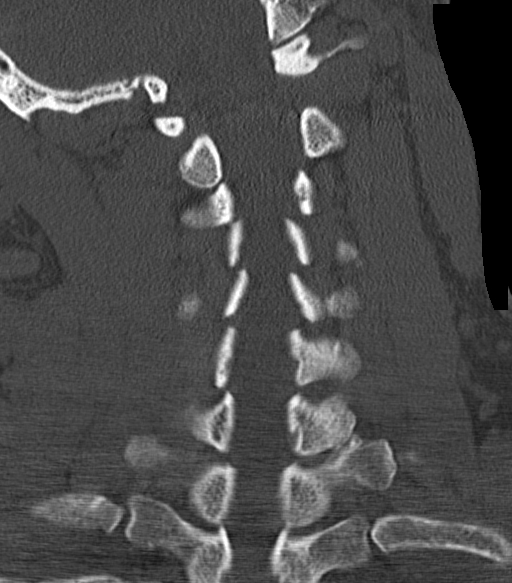

[12 of 33 positions shown; findings below may reference images not displayed]

FINDINGS: No fracture. No spondylolisthesis. Head is tilted to the right.
There are degenerative changes with loss of disc height endplate
spurring most evident from C2-C3 through C5-C6. Uncovertebral
spurring and some facet spurring causes neural foraminal narrowing.
This is greatest on the left at C3-C4 where it is severe.

No evidence of a disc herniation.

Surrounding soft tissues are unremarkable.  Lung apices are clear.
IMPRESSION: 1. No fracture or acute findings.
2. Degenerative changes as described.

## 2017-01-26 ENCOUNTER — Emergency Department (HOSPITAL_COMMUNITY)
Admission: EM | Admit: 2017-01-26 | Discharge: 2017-01-26 | Disposition: A | Discharge status: Home / Self Care | Payer: Medicare Other | Attending: Emergency Medicine | Admitting: Emergency Medicine

## 2017-01-26 ENCOUNTER — Emergency Department (HOSPITAL_COMMUNITY): Payer: Medicare Other

## 2017-01-26 ENCOUNTER — Encounter (HOSPITAL_COMMUNITY): Payer: Self-pay | Admitting: Cardiology

## 2017-01-26 DIAGNOSIS — R072 Precordial pain: Secondary | ICD-10-CM | POA: Diagnosis not present

## 2017-01-26 DIAGNOSIS — R0789 Other chest pain: Secondary | ICD-10-CM | POA: Diagnosis not present

## 2017-01-26 DIAGNOSIS — R079 Chest pain, unspecified: Secondary | ICD-10-CM | POA: Diagnosis present

## 2017-01-26 LAB — BASIC METABOLIC PANEL
ANION GAP: 7 (ref 5–15)
BUN: 15 mg/dL (ref 6–20)
CALCIUM: 9.3 mg/dL (ref 8.9–10.3)
CHLORIDE: 105 mmol/L (ref 101–111)
CO2: 28 mmol/L (ref 22–32)
CREATININE: 1.16 mg/dL (ref 0.61–1.24)
GFR calc non Af Amer: >60 mL/min (ref >60)
GLUCOSE: 94 mg/dL (ref 65–99)
Potassium: 4.4 mmol/L (ref 3.5–5.1)
Sodium: 140 mmol/L (ref 135–145)

## 2017-01-26 LAB — CBC
HCT: 43.9 % (ref 39.0–52.0)
HEMOGLOBIN: 14.8 g/dL (ref 13.0–17.0)
MCH: 30.3 pg (ref 26.0–34.0)
MCHC: 33.7 g/dL (ref 30.0–36.0)
MCV: 89.8 fL (ref 78.0–100.0)
Platelets: 221 10*3/uL (ref 150–400)
RBC: 4.89 MIL/uL (ref 4.22–5.81)
RDW: 12.6 % (ref 11.5–15.5)
WBC: 3.3 10*3/uL — ABNORMAL LOW (ref 4.0–10.5)

## 2017-01-26 LAB — TROPONIN I

## 2017-01-26 NOTE — ED Triage Notes (Signed)
Chest pain times 2 days.   

## 2017-01-26 NOTE — Discharge Instructions (Signed)
Follow-up for appointment on Friday, April 6 840 in the morning with cardiology. If he develop persistent chest pain or other concerning symptoms before then please return the emergency room.  If you were given medicines take as directed.  If you are on coumadin or contraceptives realize their levels and effectiveness is altered by many different medicines.  If you have any reaction (rash, tongues swelling, other) to the medicines stop taking and see a physician.    If your blood pressure was elevated in the ER make sure you follow up for management with a primary doctor or return for chest pain, shortness of breath or stroke symptoms.  Please follow up as directed and return to the ER or see a physician for new or worsening symptoms.  Thank you. Vitals:   01/26/17 0745 01/26/17 0800 01/26/17 0830 01/26/17 0900  BP: 126/78 94/81 117/75 119/65  Pulse: 106 99 101 97  Resp: 25 18 18 16   Temp:      TempSrc:      SpO2: 97% 96% 95% 94%  Weight:      Height:

## 2017-01-26 NOTE — ED Provider Notes (Signed)
[facility]-EMER[facility]ENCY DEPT Provider Note   CSN: [id number] Arrival date & time: 01/26/17  3086     History   Chief Complaint Chief Complaint  Patient presents with  . Chest Pain    HPI Peter Levy is a 61 y.o. male.  Patient presents with left upper chest pain lasting partially 1 hour from the group home. No history of similar. No history of heart problems or blood clotting history. No recent surgeries or leg swelling. Patient has no symptoms currently. Timing is randomof exertional component. Patient does have cerebral palsy history. No recent stress test.      Past Medical History:  Diagnosis Date  . CP (cerebral palsy) (HCC)     There are no active problems to display for this patient.   History reviewed. No pertinent surgical history.     Home Medications    Prior to Admission medications   Medication Sig Start Date End Date Taking? Authorizing Provider  methocarbamol (ROBAXIN) 500 MG tablet Take 1 tablet (500 mg total) by mouth 2 (two) times daily as needed for muscle spasms. Patient not taking: Reported on 01/26/2017 03/04/15   Samuel Jester, DO    Family History History reviewed. No pertinent family history.  Social History Social History  Substance Use Topics  . Smoking status: Never Smoker  . Smokeless tobacco: Never Used  . Alcohol use No     Allergies   Patient has no known allergies.   Review of Systems Review of Systems  Constitutional: Negative for chills and fever.  HENT: Negative for congestion.   Eyes: Negative for visual disturbance.  Respiratory: Negative for shortness of breath.   Cardiovascular: Positive for chest pain. Negative for leg swelling.  Gastrointestinal: Negative for abdominal pain and vomiting.  Genitourinary: Negative for dysuria and flank pain.  Musculoskeletal: Negative for back pain, neck pain and neck stiffness.  Skin: Negative for rash.  Neurological: Negative for light-headedness and headaches.      Physical Exam Updated Vital Signs BP 111/74   Pulse 94   Temp 98.4 F (36.9 C) (Oral)   Resp 18   Ht 5\' 8"  (1.727 m)   Wt 184 lb (83.5 kg)   SpO2 97%   BMI 27.98 kg/m   Physical Exam  Constitutional: He is oriented to person, place, and time. He appears well-developed and well-nourished.  HENT:  Head: Normocephalic and atraumatic.  Eyes: Conjunctivae are normal. Right eye exhibits no discharge. Left eye exhibits no discharge.  Neck: Normal range of motion. Neck supple. No tracheal deviation present.  Cardiovascular: Normal rate, regular rhythm and intact distal pulses.   Pulmonary/Chest: Effort normal and breath sounds normal.  Abdominal: Soft. He exhibits no distension. There is no tenderness. There is no guarding.  Musculoskeletal: He exhibits no edema.  Neurological: He is alert and oriented to person, place, and time.  Skin: Skin is warm. No rash noted.  Psychiatric: He has a normal mood and affect.  Nursing note and vitals reviewed.    ED Treatments / Results  Labs (all labs ordered are listed, but only abnormal results are displayed) Labs Reviewed  CBC - Abnormal; Notable for the following:       Result Value   WBC 3.3 (*)    All other components within normal limits  BASIC METABOLIC PANEL  TROPONIN I  TROPONIN I    EKG  EKG Interpretation  Date/Time:  Monday January 26 2017 07:44:23 EDT Ventricular Rate:  106 PR Interval:    QRS Duration:  102 QT Interval:  335 QTC Calculation: 445 R Axis:   47 Text Interpretation:  Sinus tachycardia Low voltage, extremity leads Posterior infarct, old Baseline wander in lead(s) V1 V2 V3 Overall similar to previous Confirmed by Jodi MourningZAVITZ MD, [place] Navarrette [phone]([id number]) on 01/26/2017 8:39:05 AM       Radiology Dg Chest 2 View  Result Date: 01/26/2017 CLINICAL DATA:  Chest pain.  Cerebral palsy. EXAM: CHEST  2 VIEW COMPARISON:  11/04/2011 FINDINGS: Lung volumes are extremely low bilaterally. There is no evidence of pulmonary  edema, consolidation, pneumothorax, nodule or pleural fluid. The heart size is stable and within normal limits. IMPRESSION: Very low bilateral lung volumes. Electronically Signed   By: Irish LackGlenn  Yamagata M.D.   On: 01/26/2017 08:04    Procedures Procedures (including critical care time)  Medications Ordered in ED Medications - No data to display   Initial Impression / Assessment and Plan / ED Course  I have reviewed the triage vital signs and the nursing notes.  Pertinent labs & imaging results that were available during my care of the patient were reviewed by me and considered in my medical decision making (see chart for details).     Patient overall low risk for ACS, no blood clot risk factors presents after an episode of chest discomfort. Patient's initial workup in the ER was unremarkable with blood work returning within normal limits troponin negative and patient is chest pain-free. Discussed the case with local cardiologist on call who agrees to see the patient in the office to discuss stress test. Discuss with cardiology and scheduled appointment.  CP free in ED.   Results and differential diagnosis were discussed with the patient/parent/guardian. Xrays were independently reviewed by myself.  Close follow up outpatient was discussed, comfortable with the plan.   Medications - No data to display  Vitals:   01/26/17 0800 01/26/17 0830 01/26/17 0900 01/26/17 1100  BP: 94/81 117/75 119/65 111/74  Pulse: 99 101 97 94  Resp: 18 18 16 18   Temp:      TempSrc:      SpO2: 96% 95% 94% 97%  Weight:      Height:        Final diagnoses:  Chest pain, precordial     Final Clinical Impressions(s) / ED Diagnoses   Final diagnoses:  Chest pain, precordial    New Prescriptions New Prescriptions   No medications on file     Blane OharaJoshua Agustin Swatek, MD 01/26/17 1234

## 2017-01-26 NOTE — ED Notes (Signed)
Lab at bedside

## 2017-02-19 DIAGNOSIS — G809 Cerebral palsy, unspecified: Secondary | ICD-10-CM | POA: Diagnosis not present

## 2017-02-19 DIAGNOSIS — Z125 Encounter for screening for malignant neoplasm of prostate: Secondary | ICD-10-CM | POA: Diagnosis not present

## 2017-02-19 DIAGNOSIS — E6609 Other obesity due to excess calories: Secondary | ICD-10-CM | POA: Diagnosis not present

## 2017-02-19 DIAGNOSIS — E669 Obesity, unspecified: Secondary | ICD-10-CM | POA: Diagnosis not present

## 2017-02-19 DIAGNOSIS — G803 Athetoid cerebral palsy: Secondary | ICD-10-CM | POA: Diagnosis not present

## 2017-02-19 DIAGNOSIS — Z6831 Body mass index (BMI) 31.0-31.9, adult: Secondary | ICD-10-CM | POA: Diagnosis not present

## 2017-02-19 DIAGNOSIS — Z0001 Encounter for general adult medical examination with abnormal findings: Secondary | ICD-10-CM | POA: Diagnosis not present

## 2017-02-19 DIAGNOSIS — Z1389 Encounter for screening for other disorder: Secondary | ICD-10-CM | POA: Diagnosis not present

## 2017-02-19 DIAGNOSIS — E782 Mixed hyperlipidemia: Secondary | ICD-10-CM | POA: Diagnosis not present

## 2017-02-20 ENCOUNTER — Ambulatory Visit (INDEPENDENT_AMBULATORY_CARE_PROVIDER_SITE_OTHER): Payer: Medicare Other | Admitting: Cardiovascular Disease

## 2017-02-20 ENCOUNTER — Encounter: Payer: Self-pay | Admitting: Cardiovascular Disease

## 2017-02-20 VITALS — BP 118/72 | HR 89 | Ht 67.0 in | Wt 177.0 lb

## 2017-02-20 DIAGNOSIS — R072 Precordial pain: Secondary | ICD-10-CM | POA: Diagnosis not present

## 2017-02-20 DIAGNOSIS — Z9289 Personal history of other medical treatment: Secondary | ICD-10-CM

## 2017-02-20 NOTE — Patient Instructions (Signed)
Your physician recommends that you schedule a follow-up appointment in:  As needed        Thank you for choosing [facility] !         

## 2017-02-20 NOTE — Progress Notes (Signed)
CARDIOLOGY CONSULT NOTE  Patient ID: BALLARD BUDNEY MRN: [id number] DOB/AGE: 04/25/1956 61 y.o.  Admit date: (Not on file) Primary Physician: [facility], Dr. Phillips Odor Referring Physician: Dr. Phillips Odor  Reason for Consultation: chest pain  HPI: Peter Levy is a 61 y.o. male who is being seen today for the evaluation of chest pain at the request of Dr. Phillips Odor.   He was evaluated in the ED for chest pain on 01/26/17. I personally reviewed all documentation, labs, studies, and ECGs.  Vital signs in the ED, BP 111/74, heart rate 94. Oxygen saturations were normal. Troponins and basic metabolic panel normal. There was a mild leukopenia, WBCs 3.3.  Chest x-ray showed very low bilateral lung volumes.  ECG which I personally interpreted demonstrated sinus tachycardia, 106 bpm.  This isolated episode of chest pain occurred before he ate a meal. It was retrosternal in location and sharp in quality and lasted 20-30 minutes. It had subsided by the time the ambulance arrived. He has had no prior episodes of chest pain to this episode and none since. He denies exertional dyspnea, palpitations, orthopnea, and leg swelling. He denies dysphagia for solids and liquids and also denies a history of GERD.  He does not smoke.  He also has cerebral palsy. He has occasional neck spasms related to this.        No Known Allergies  No current outpatient prescriptions on file.   No current facility-administered medications for this visit.     Past Medical History:  Diagnosis Date  . CP (cerebral palsy) [facility])     Past Surgical History:  Procedure Laterality Date  . none     none    Social History   Social History  . Marital status: Single    Spouse name: N/A  . Number of children: N/A  . Years of education: N/A   Occupational History  . Not on file.   Social History Main Topics  . Smoking status: Never Smoker  . Smokeless tobacco: Never Used  .  Alcohol use No  . Drug use: No  . Sexual activity: Not on file   Other Topics Concern  . Not on file   Social History Narrative  . No narrative on file     No family history of premature CAD in 1st degree relatives.  No outpatient prescriptions have been marked as taking for the 02/20/17 encounter (Office Visit) with Laqueta Linden, MD.      Review of systems complete and found to be negative unless listed above in HPI    Physical exam Blood pressure 118/72, pulse 89, height  (1.702 m), weight 177 lb (80.3 kg), SpO2 95 %. General: NAD Neck: No JVD, no thyromegaly or thyroid nodule.  Lungs: Clear to auscultation bilaterally with normal respiratory effort. CV: Nondisplaced PMI. Regular rate and rhythm, normal S1/S2, no S3/S4, no murmur.  No peripheral edema.  No carotid bruit.   Abdomen: Soft, nontender, no distention.  Skin: Intact without lesions or rashes.  Neurologic: Alert and oriented x 3.  Psych: Normal affect. Extremities: No clubbing or cyanosis.  HEENT: Normal.   ECG: Most recent ECG reviewed.   Labs: Lab Results  Component Value Date/Time   K 4.4 01/26/2017 08:06 AM   BUN 15 01/26/2017 08:06 AM   CREATININE 1.16 01/26/2017 08:06 AM   HGB 14.8 01/26/2017 08:06 AM     Lipids: No results found for: LDLCALC, LDLDIRECT, CHOL, TRIG, HDL  ASSESSMENT AND PLAN:  1. Precordial pain: Isolated episode as detailed above. No traditional cardiovascular risk factors. ED studies were essentially normal. I told him to contact me should he have additional episodes of chest pain at which time I would consider noninvasive imaging.   Disposition: Follow up prn  Signed: Prentice Docker, M.D., F.A.C.C.  02/20/2017, 8:39 AM

## 2017-04-14 DIAGNOSIS — E782 Mixed hyperlipidemia: Secondary | ICD-10-CM | POA: Diagnosis not present

## 2017-04-14 DIAGNOSIS — F73 Profound intellectual disabilities: Secondary | ICD-10-CM | POA: Diagnosis not present

## 2017-04-14 DIAGNOSIS — E663 Overweight: Secondary | ICD-10-CM | POA: Diagnosis not present

## 2017-04-14 DIAGNOSIS — G809 Cerebral palsy, unspecified: Secondary | ICD-10-CM | POA: Diagnosis not present

## 2017-04-14 DIAGNOSIS — Z6829 Body mass index (BMI) 29.0-29.9, adult: Secondary | ICD-10-CM | POA: Diagnosis not present

## 2017-09-23 DIAGNOSIS — Z23 Encounter for immunization: Secondary | ICD-10-CM | POA: Diagnosis not present

## 2018-03-16 DIAGNOSIS — Z1389 Encounter for screening for other disorder: Secondary | ICD-10-CM | POA: Diagnosis not present

## 2018-03-16 DIAGNOSIS — E782 Mixed hyperlipidemia: Secondary | ICD-10-CM | POA: Diagnosis not present

## 2018-03-16 DIAGNOSIS — G803 Athetoid cerebral palsy: Secondary | ICD-10-CM | POA: Diagnosis not present

## 2018-03-16 DIAGNOSIS — Z6830 Body mass index (BMI) 30.0-30.9, adult: Secondary | ICD-10-CM | POA: Diagnosis not present

## 2018-03-16 DIAGNOSIS — F73 Profound intellectual disabilities: Secondary | ICD-10-CM | POA: Diagnosis not present

## 2018-03-16 DIAGNOSIS — E669 Obesity, unspecified: Secondary | ICD-10-CM | POA: Diagnosis not present

## 2018-03-16 DIAGNOSIS — Z0000 Encounter for general adult medical examination without abnormal findings: Secondary | ICD-10-CM | POA: Diagnosis not present

## 2018-04-15 ENCOUNTER — Encounter: Payer: Self-pay | Admitting: General Surgery

## 2018-04-15 ENCOUNTER — Ambulatory Visit (INDEPENDENT_AMBULATORY_CARE_PROVIDER_SITE_OTHER): Payer: Medicare Other | Admitting: General Surgery

## 2018-04-15 VITALS — BP 143/73 | HR 76 | Temp 97.5°F | Resp 18 | Wt 161.0 lb

## 2018-04-15 DIAGNOSIS — Z1211 Encounter for screening for malignant neoplasm of colon: Secondary | ICD-10-CM | POA: Diagnosis not present

## 2018-04-15 MED ORDER — PEG 3350-KCL-NABCB-NACL-NASULF 236 G PO SOLR: 4000.0000 mL | Freq: Once | ORAL | 0 refills | Status: AC

## 2018-04-15 NOTE — Progress Notes (Signed)
Peter Levy; [id number]; 07-29-1956   HPI Patient is a 62 year old black male who was referred to my care by Dr. Phillips Odor for the need for a screening colonoscopy.  He last had a colonoscopy in 2009.  He denies any blood in his stools, abnormal diarrhea or constipation, abdominal pain, or family history of colon cancer.  Patient has 0 out of 10 abdominal pain. Past Medical History:  Diagnosis Date  . CP (cerebral palsy) [facility])     Past Surgical History:  Procedure Laterality Date  . none     none    History reviewed. No pertinent family history.  No current outpatient medications on file prior to visit.   No current facility-administered medications on file prior to visit.     No Known Allergies  Social History   Substance and Sexual Activity  Alcohol Use No    Social History   Tobacco Use  Smoking Status Never Smoker  Smokeless Tobacco Never Used    Review of Systems  Constitutional: Negative.   HENT: Negative.   Eyes: Negative.   Respiratory: Negative.   Cardiovascular: Negative.   Gastrointestinal: Negative.   Genitourinary: Negative.   Musculoskeletal:       Abnormal gait due to cerebral palsy  Skin: Negative.   Neurological: Positive for tremors.  Endo/Heme/Allergies: Negative.   Psychiatric/Behavioral: Negative.     Objective   Vitals:   04/15/18 1103  BP: (!) 143/73  Pulse: 76  Resp: 18  Temp: (!) 97.5 F (36.4 C)    Physical Exam  Constitutional: He is oriented to person, place, and time. He appears well-developed and well-nourished. No distress.  Cardiovascular: Normal rate, regular rhythm and normal heart sounds. Exam reveals no gallop and no friction rub.  No murmur heard. Pulmonary/Chest: Effort normal and breath sounds normal. No stridor. No respiratory distress. He has no wheezes. He has no rales.  Abdominal: Soft. Bowel sounds are normal. He exhibits no distension and no mass. There is no tenderness. There is no guarding.   Neurological: He is alert and oriented to person, place, and time. Coordination abnormal.  Uses a walker.  Skin: Skin is warm and dry.  Vitals reviewed.   Assessment  Need for screening colonoscopy Plan   Patient scheduled for screening colonoscopy on 05/14/2018.  The risks and benefits of the procedure including bleeding and perforation were fully explained to the patient, who gave informed consent.  GoLYTELY has been prescribed.

## 2018-04-15 NOTE — Patient Instructions (Signed)
Colonoscopy, Adult A colonoscopy is an exam to look at the entire large intestine. During the exam, a lubricated, bendable tube is inserted into the anus and then passed into the rectum, colon, and other parts of the large intestine. A colonoscopy is often done as a part of normal colorectal screening or in response to certain symptoms, such as anemia, persistent diarrhea, abdominal pain, and blood in the stool. The exam can help screen for and diagnose medical problems, including:  Tumors.  Polyps.  Inflammation.  Areas of bleeding.  Tell a health care provider about:  Any allergies you have.  All medicines you are taking, including vitamins, herbs, eye drops, creams, and over-the-counter medicines.  Any problems you or family members have had with anesthetic medicines.  Any blood disorders you have.  Any surgeries you have had.  Any medical conditions you have.  Any problems you have had passing stool. What are the risks? Generally, this is a safe procedure. However, problems may occur, including:  Bleeding.  A tear in the intestine.  A reaction to medicines given during the exam.  Infection (rare).  What happens before the procedure? Eating and drinking restrictions Follow instructions from your health care provider about eating and drinking, which may include:  A few days before the procedure - follow a low-fiber diet. Avoid nuts, seeds, dried fruit, raw fruits, and vegetables.  1-3 days before the procedure - follow a clear liquid diet. Drink only clear liquids, such as clear broth or bouillon, black coffee or tea, clear juice, clear soft drinks or sports drinks, gelatin dessert, and popsicles. Avoid any liquids that contain red or purple dye.  On the day of the procedure - do not eat or drink anything during the 2 hours before the procedure, or within the time period that your health care provider recommends.  Bowel prep If you were prescribed an oral bowel prep  to clean out your colon:  Take it as told by your health care provider. Starting the day before your procedure, you will need to drink a large amount of medicated liquid. The liquid will cause you to have multiple loose stools until your stool is almost clear or light green.  If your skin or anus gets irritated from diarrhea, you may use these to relieve the irritation: ? Medicated wipes, such as adult wet wipes with aloe and vitamin E. ? A skin soothing-product like petroleum jelly.  If you vomit while drinking the bowel prep, take a break for up to 60 minutes and then begin the bowel prep again. If vomiting continues and you cannot take the bowel prep without vomiting, call your health care provider.  General instructions  Ask your health care provider about changing or stopping your regular medicines. This is especially important if you are taking diabetes medicines or blood thinners.  Plan to have someone take you home from the hospital or clinic. What happens during the procedure?  An IV tube may be inserted into one of your veins.  You will be given medicine to help you relax (sedative).  To reduce your risk of infection: ? Your health care team will wash or sanitize their hands. ? Your anal area will be washed with soap.  You will be asked to lie on your side with your knees bent.  Your health care provider will lubricate a long, thin, flexible tube. The tube will have a camera and a light on the end.  The tube will be inserted into your   anus.  The tube will be gently eased through your rectum and colon.  Air will be delivered into your colon to keep it open. You may feel some pressure or cramping.  The camera will be used to take images during the procedure.  A small tissue sample may be removed from your body to be examined under a microscope (biopsy). If any potential problems are found, the tissue will be sent to a lab for testing.  If small polyps are found, your  health care provider may remove them and have them checked for cancer cells.  The tube that was inserted into your anus will be slowly removed. The procedure may vary among health care providers and hospitals. What happens after the procedure?  Your blood pressure, heart rate, breathing rate, and blood oxygen level will be monitored until the medicines you were given have worn off.  Do not drive for 24 hours after the exam.  You may have a small amount of blood in your stool.  You may pass gas and have mild abdominal cramping or bloating due to the air that was used to inflate your colon during the exam.  It is up to you to get the results of your procedure. Ask your health care provider, or the department performing the procedure, when your results will be ready. This information is not intended to replace advice given to you by your health care provider. Make sure you discuss any questions you have with your health care provider. Document Released: 10/31/2000 Document Revised: 09/03/2016 Document Reviewed: 01/15/2016 Elsevier Interactive Patient Education  2018 [employer]  

## 2018-04-16 NOTE — H&P (Signed)
Peter HammockRudolph V Levy; [id number]; 05/11/1956   HPI Patient is a 62 year old black male who was referred to my care by Dr. Phillips OdorGolding for the need for a screening colonoscopy.  He last had a colonoscopy in 2009.  He denies any blood in his stools, abnormal diarrhea or constipation, abdominal pain, or family history of colon cancer.  Patient has 0 out of 10 abdominal pain. Past Medical History:  Diagnosis Date  . CP (cerebral palsy) [facility](HCC)     Past Surgical History:  Procedure Laterality Date  . none     none    History reviewed. No pertinent family history.  No current outpatient medications on file prior to visit.   No current facility-administered medications on file prior to visit.     No Known Allergies  Social History   Substance and Sexual Activity  Alcohol Use No    Social History   Tobacco Use  Smoking Status Never Smoker  Smokeless Tobacco Never Used    Review of Systems  Constitutional: Negative.   HENT: Negative.   Eyes: Negative.   Respiratory: Negative.   Cardiovascular: Negative.   Gastrointestinal: Negative.   Genitourinary: Negative.   Musculoskeletal:       Abnormal gait due to cerebral palsy  Skin: Negative.   Neurological: Positive for tremors.  Endo/Heme/Allergies: Negative.   Psychiatric/Behavioral: Negative.     Objective   Vitals:   04/15/18 1103  BP: (!) 143/73  Pulse: 76  Resp: 18  Temp: (!) 97.5 F (36.4 C)    Physical Exam  Constitutional: He is oriented to person, place, and time. He appears well-developed and well-nourished. No distress.  Cardiovascular: Normal rate, regular rhythm and normal heart sounds. Exam reveals no gallop and no friction rub.  No murmur heard. Pulmonary/Chest: Effort normal and breath sounds normal. No stridor. No respiratory distress. He has no wheezes. He has no rales.  Abdominal: Soft. Bowel sounds are normal. He exhibits no distension and no mass. There is no tenderness. There is no guarding.   Neurological: He is alert and oriented to person, place, and time. Coordination abnormal.  Uses a walker.  Skin: Skin is warm and dry.  Vitals reviewed.   Assessment  Need for screening colonoscopy Plan   Patient scheduled for screening colonoscopy on 05/14/2018.  The risks and benefits of the procedure including bleeding and perforation were fully explained to the patient, who gave informed consent.  GoLYTELY has been prescribed.

## 2018-05-04 DIAGNOSIS — Z0001 Encounter for general adult medical examination with abnormal findings: Secondary | ICD-10-CM | POA: Diagnosis not present

## 2018-05-04 DIAGNOSIS — Z1389 Encounter for screening for other disorder: Secondary | ICD-10-CM | POA: Diagnosis not present

## 2018-05-04 DIAGNOSIS — F73 Profound intellectual disabilities: Secondary | ICD-10-CM | POA: Diagnosis not present

## 2018-05-04 DIAGNOSIS — G809 Cerebral palsy, unspecified: Secondary | ICD-10-CM | POA: Diagnosis not present

## 2018-05-04 DIAGNOSIS — Z6830 Body mass index (BMI) 30.0-30.9, adult: Secondary | ICD-10-CM | POA: Diagnosis not present

## 2018-05-06 NOTE — Patient Instructions (Signed)
Peter Levy  05/06/2018     @PREFPERIOPPHARMACY @   Your procedure is scheduled on  05/14/2018 .  Report to [facility] at  615   A.M.  Call this number if you have problems the morning of surgery:  [phone]   Remember:  Do not eat or drink after midnight.  You may drink clear liquids until (follow the instructions given to you) .  Clear liquids allowed are:                    Water, Juice (non-citric and without pulp), Carbonated beverages, Clear Tea, Black Coffee only, Plain Jell-O only, Gatorade and Plain Popsicles only    Take these medicines the morning of surgery with A SIP OF WATER  None    Do not wear jewelry, make-up or nail polish.  Do not wear lotions, powders, or perfumes, or deodorant.  Do not shave 48 hours prior to surgery.  Men may shave face and neck.  Do not bring valuables to the hospital.  [facility] is not responsible for any belongings or valuables.  Contacts, dentures or bridgework may not be worn into surgery.  Leave your suitcase in the car.  After surgery it may be brought to your room.  For patients admitted to the hospital, discharge time will be determined by your treatment team.  Patients discharged the day of surgery will not be allowed to drive home.   Name and phone number of your driver:   family Special instructions:  Follow the diet and prep instructions given to you by Dr Lovell Sheehan office.  Please read over the following fact sheets that you were given. Anesthesia Post-op Instructions and Care and Recovery After Surgery       Colonoscopy, Adult A colonoscopy is an exam to look at the large intestine. It is done to check for problems, such as:  Lumps (tumors).  Growths (polyps).  Swelling (inflammation).  Bleeding.  What happens before the procedure? Eating and drinking Follow instructions from your doctor about eating and drinking. These instructions may include:  A few days before the procedure - follow a  low-fiber diet. ? Avoid nuts. ? Avoid seeds. ? Avoid dried fruit. ? Avoid raw fruits. ? Avoid vegetables.  1-3 days before the procedure - follow a clear liquid diet. Avoid liquids that have red or purple dye. Drink only clear liquids, such as: ? Clear broth or bouillon. ? Black coffee or tea. ? Clear juice. ? Clear soft drinks or sports drinks. ? Gelatin dessert. ? Popsicles.  On the day of the procedure - do not eat or drink anything during the 2 hours before the procedure.  Bowel prep If you were prescribed an oral bowel prep:  Take it as told by your doctor. Starting the day before your procedure, you will need to drink a lot of liquid. The liquid will cause you to poop (have bowel movements) until your poop is almost clear or light green.  If your skin or butt gets irritated from diarrhea, you may: ? Wipe the area with wipes that have medicine in them, such as adult wet wipes with aloe and vitamin E. ? Put something on your skin that soothes the area, such as petroleum jelly.  If you throw up (vomit) while drinking the bowel prep, take a break for up to 60 minutes. Then begin the bowel prep again. If you keep throwing up and you  cannot take the bowel prep without throwing up, call your doctor.  General instructions  Ask your doctor about changing or stopping your normal medicines. This is important if you take diabetes medicines or blood thinners.  Plan to have someone take you home from the hospital or clinic. What happens during the procedure?  An IV tube may be put into one of your veins.  You will be given medicine to help you relax (sedative).  To reduce your risk of infection: ? Your doctors will wash their hands. ? Your anal area will be washed with soap.  You will be asked to lie on your side with your knees bent.  Your doctor will get a long, thin, flexible tube ready. The tube will have a camera and a light on the end.  The tube will be put into your  anus.  The tube will be gently put into your large intestine.  Air will be delivered into your large intestine to keep it open. You may feel some pressure or cramping.  The camera will be used to take photos.  A small tissue sample may be removed from your body to be looked at under a microscope (biopsy). If any possible problems are found, the tissue will be sent to a lab for testing.  If small growths are found, your doctor may remove them and have them checked for cancer.  The tube that was put into your anus will be slowly removed. The procedure may vary among doctors and hospitals. What happens after the procedure?  Your doctor will check on you often until the medicines you were given have worn off.  Do not drive for 24 hours after the procedure.  You may have a small amount of blood in your poop.  You may pass gas.  You may have mild cramps or bloating in your belly (abdomen).  It is up to you to get the results of your procedure. Ask your doctor, or the department performing the procedure, when your results will be ready. This information is not intended to replace advice given to you by your health care provider. Make sure you discuss any questions you have with your health care provider. Document Released: 12/06/2010 Document Revised: 09/03/2016 Document Reviewed: 01/15/2016 Elsevier Interactive Patient Education  2017 [employer] Inc.  Colonoscopy, Adult, Care After This sheet gives you information about how to care for yourself after your procedure. Your health care provider may also give you more specific instructions. If you have problems or questions, contact your health care provider. What can I expect after the procedure? After the procedure, it is common to have:  A small amount of blood in your stool for 24 hours after the procedure.  Some gas.  Mild abdominal cramping or bloating.  Follow these instructions at home: General instructions   For the first  24 hours after the procedure: ? Do not drive or use machinery. ? Do not sign important documents. ? Do not drink alcohol. ? Do your regular daily activities at a slower pace than normal. ? Eat soft, easy-to-digest foods. ? Rest often.  Take over-the-counter or prescription medicines only as told by your health care provider.  It is up to you to get the results of your procedure. Ask your health care provider, or the department performing the procedure, when your results will be ready. Relieving cramping and bloating  Try walking around when you have cramps or feel bloated.  Apply heat to your abdomen as told by  your health care provider. Use a heat source that your health care provider recommends, such as a moist heat pack or a heating pad. ? Place a towel between your skin and the heat source. ? Leave the heat on for 20-30 minutes. ? Remove the heat if your skin turns bright red. This is especially important if you are unable to feel pain, heat, or cold. You may have a greater risk of getting burned. Eating and drinking  Drink enough fluid to keep your urine clear or pale yellow.  Resume your normal diet as instructed by your health care provider. Avoid heavy or fried foods that are hard to digest.  Avoid drinking alcohol for as long as instructed by your health care provider. Contact a health care provider if:  You have blood in your stool 2-3 days after the procedure. Get help right away if:  You have more than a small spotting of blood in your stool.  You pass large blood clots in your stool.  Your abdomen is swollen.  You have nausea or vomiting.  You have a fever.  You have increasing abdominal pain that is not relieved with medicine. This information is not intended to replace advice given to you by your health care provider. Make sure you discuss any questions you have with your health care provider. Document Released: 06/17/2004 Document Revised: 07/28/2016  Document Reviewed: 01/15/2016 Elsevier Interactive Patient Education  2018 [place] Anesthesia is a term that refers to techniques, procedures, and medicines that help a person stay safe and comfortable during a medical procedure. Monitored anesthesia care, or sedation, is one type of anesthesia. Your anesthesia specialist may recommend sedation if you will be having a procedure that does not require you to be unconscious, such as:  Cataract surgery.  A dental procedure.  A biopsy.  A colonoscopy.  During the procedure, you may receive a medicine to help you relax (sedative). There are three levels of sedation:  Mild sedation. At this level, you may feel awake and relaxed. You will be able to follow directions.  Moderate sedation. At this level, you will be sleepy. You may not remember the procedure.  Deep sedation. At this level, you will be asleep. You will not remember the procedure.  The more medicine you are given, the deeper your level of sedation will be. Depending on how you respond to the procedure, the anesthesia specialist may change your level of sedation or the type of anesthesia to fit your needs. An anesthesia specialist will monitor you closely during the procedure. Let your health care provider know about:  Any allergies you have.  All medicines you are taking, including vitamins, herbs, eye drops, creams, and over-the-counter medicines.  Any use of steroids (by mouth or as a cream).  Any problems you or family members have had with sedatives and anesthetic medicines.  Any blood disorders you have.  Any surgeries you have had.  Any medical conditions you have, such as sleep apnea.  Whether you are pregnant or may be pregnant.  Any use of cigarettes, alcohol, or street drugs. What are the risks? Generally, this is a safe procedure. However, problems may occur, including:  Getting too much medicine  (oversedation).  Nausea.  Allergic reaction to medicines.  Trouble breathing. If this happens, a breathing tube may be used to help with breathing. It will be removed when you are awake and breathing on your own.  Heart trouble.  Lung trouble.  Before  the procedure Staying hydrated Follow instructions from your health care provider about hydration, which may include:  Up to 2 hours before the procedure - you may continue to drink clear liquids, such as water, clear fruit juice, black coffee, and plain tea.  Eating and drinking restrictions Follow instructions from your health care provider about eating and drinking, which may include:  8 hours before the procedure - stop eating heavy meals or foods such as meat, fried foods, or fatty foods.  6 hours before the procedure - stop eating light meals or foods, such as toast or cereal.  6 hours before the procedure - stop drinking milk or drinks that contain milk.  2 hours before the procedure - stop drinking clear liquids.  Medicines Ask your health care provider about:  Changing or stopping your regular medicines. This is especially important if you are taking diabetes medicines or blood thinners.  Taking medicines such as aspirin and ibuprofen. These medicines can thin your blood. Do not take these medicines before your procedure if your health care provider instructs you not to.  Tests and exams  You will have a physical exam.  You may have blood tests done to show: ? How well your kidneys and liver are working. ? How well your blood can clot.  General instructions  Plan to have someone take you home from the hospital or clinic.  If you will be going home right after the procedure, plan to have someone with you for 24 hours.  What happens during the procedure?  Your blood pressure, heart rate, breathing, level of pain and overall condition will be monitored.  An IV tube will be inserted into one of your  veins.  Your anesthesia specialist will give you medicines as needed to keep you comfortable during the procedure. This may mean changing the level of sedation.  The procedure will be performed. After the procedure  Your blood pressure, heart rate, breathing rate, and blood oxygen level will be monitored until the medicines you were given have worn off.  Do not drive for 24 hours if you received a sedative.  You may: ? Feel sleepy, clumsy, or nauseous. ? Feel forgetful about what happened after the procedure. ? Have a sore throat if you had a breathing tube during the procedure. ? Vomit. This information is not intended to replace advice given to you by your health care provider. Make sure you discuss any questions you have with your health care provider. Document Released: 07/30/2005 Document Revised: 04/11/2016 Document Reviewed: 02/24/2016 Elsevier Interactive Patient Education  2018 [place], Care After These instructions provide you with information about caring for yourself after your procedure. Your health care provider may also give you more specific instructions. Your treatment has been planned according to current medical practices, but problems sometimes occur. Call your health care provider if you have any problems or questions after your procedure. What can I expect after the procedure? After your procedure, it is common to:  Feel sleepy for several hours.  Feel clumsy and have poor balance for several hours.  Feel forgetful about what happened after the procedure.  Have poor judgment for several hours.  Feel nauseous or vomit.  Have a sore throat if you had a breathing tube during the procedure.  Follow these instructions at home: For at least 24 hours after the procedure:   Do not: ? Participate in activities in which you could fall or become injured. ? Drive. ? Use  heavy machinery. ? Drink alcohol. ? Take sleeping pills or  medicines that cause drowsiness. ? Make important decisions or sign legal documents. ? Take care of children on your own.  Rest. Eating and drinking  Follow the diet that is recommended by your health care provider.  If you vomit, drink water, juice, or soup when you can drink without vomiting.  Make sure you have little or no nausea before eating solid foods. General instructions  Have a responsible adult stay with you until you are awake and alert.  Take over-the-counter and prescription medicines only as told by your health care provider.  If you smoke, do not smoke without supervision.  Keep all follow-up visits as told by your health care provider. This is important. Contact a health care provider if:  You keep feeling nauseous or you keep vomiting.  You feel light-headed.  You develop a rash.  You have a fever. Get help right away if:  You have trouble breathing. This information is not intended to replace advice given to you by your health care provider. Make sure you discuss any questions you have with your health care provider. Document Released: 02/24/2016 Document Revised: 06/25/2016 Document Reviewed: 02/24/2016 Elsevier Interactive Patient Education  [employer].

## 2018-05-10 ENCOUNTER — Other Ambulatory Visit: Payer: Self-pay

## 2018-05-10 ENCOUNTER — Encounter (HOSPITAL_COMMUNITY)
Admission: RE | Admit: 2018-05-10 | Discharge: 2018-05-10 | Disposition: A | Discharge status: Home / Self Care | Payer: Medicare Other | Source: Ambulatory Visit | Attending: General Surgery | Admitting: General Surgery

## 2018-05-10 ENCOUNTER — Encounter (HOSPITAL_COMMUNITY): Payer: Self-pay

## 2018-05-10 DIAGNOSIS — Z01810 Encounter for preprocedural cardiovascular examination: Secondary | ICD-10-CM | POA: Diagnosis not present

## 2018-05-10 DIAGNOSIS — Z01812 Encounter for preprocedural laboratory examination: Secondary | ICD-10-CM | POA: Diagnosis not present

## 2018-05-10 LAB — BASIC METABOLIC PANEL
ANION GAP: 6 (ref 5–15)
BUN: 15 mg/dL (ref 6–20)
CALCIUM: 9 mg/dL (ref 8.9–10.3)
CO2: 30 mmol/L (ref 22–32)
CREATININE: 1.07 mg/dL (ref 0.61–1.24)
Chloride: 105 mmol/L (ref 101–111)
Glucose, Bld: 87 mg/dL (ref 65–99)
Potassium: 3.6 mmol/L (ref 3.5–5.1)
SODIUM: 141 mmol/L (ref 135–145)

## 2018-05-10 LAB — CBC
HCT: 43.6 % (ref 39.0–52.0)
Hemoglobin: 14.5 g/dL (ref 13.0–17.0)
MCH: 30.6 pg (ref 26.0–34.0)
MCHC: 33.3 g/dL (ref 30.0–36.0)
MCV: 92 fL (ref 78.0–100.0)
PLATELETS: 221 10*3/uL (ref 150–400)
RBC: 4.74 MIL/uL (ref 4.22–5.81)
RDW: 12.4 % (ref 11.5–15.5)
WBC: 4.3 10*3/uL (ref 4.0–10.5)

## 2018-05-14 ENCOUNTER — Ambulatory Visit (HOSPITAL_COMMUNITY): Payer: Medicare Other | Admitting: Anesthesiology

## 2018-05-14 ENCOUNTER — Encounter (HOSPITAL_COMMUNITY)
Admission: RE | Disposition: A | Discharge status: Skilled Nursing Facility | Payer: Self-pay | Source: Ambulatory Visit | Attending: General Surgery

## 2018-05-14 ENCOUNTER — Ambulatory Visit (HOSPITAL_COMMUNITY)
Admission: RE | Admit: 2018-05-14 | Discharge: 2018-05-14 | Disposition: A | Discharge status: Skilled Nursing Facility | Payer: Medicare Other | Source: Ambulatory Visit | Attending: General Surgery | Admitting: General Surgery

## 2018-05-14 ENCOUNTER — Encounter (HOSPITAL_COMMUNITY): Payer: Self-pay

## 2018-05-14 DIAGNOSIS — R269 Unspecified abnormalities of gait and mobility: Secondary | ICD-10-CM | POA: Diagnosis not present

## 2018-05-14 DIAGNOSIS — Z5309 Procedure and treatment not carried out because of other contraindication: Secondary | ICD-10-CM | POA: Insufficient documentation

## 2018-05-14 DIAGNOSIS — K6389 Other specified diseases of intestine: Secondary | ICD-10-CM | POA: Insufficient documentation

## 2018-05-14 DIAGNOSIS — Z538 Procedure and treatment not carried out for other reasons: Secondary | ICD-10-CM

## 2018-05-14 DIAGNOSIS — G809 Cerebral palsy, unspecified: Secondary | ICD-10-CM | POA: Insufficient documentation

## 2018-05-14 DIAGNOSIS — K562 Volvulus: Secondary | ICD-10-CM

## 2018-05-14 DIAGNOSIS — Z1211 Encounter for screening for malignant neoplasm of colon: Secondary | ICD-10-CM | POA: Diagnosis not present

## 2018-05-14 HISTORY — PX: FLEXIBLE SIGMOIDOSCOPY: SHX5431

## 2018-05-14 SURGERY — SIGMOIDOSCOPY, FLEXIBLE
Anesthesia: General

## 2018-05-14 MED ORDER — LACTATED RINGERS IV SOLN
INTRAVENOUS | Status: DC
  Administered 2018-05-14: 07:00:00 via INTRAVENOUS

## 2018-05-14 MED ORDER — PROPOFOL 500 MG/50ML IV EMUL
INTRAVENOUS | Status: DC | PRN
  Administered 2018-05-14: 150 ug/kg/min via INTRAVENOUS
  Administered 2018-05-14: 08:00:00 via INTRAVENOUS

## 2018-05-14 MED ORDER — PROPOFOL 10 MG/ML IV BOLUS
INTRAVENOUS | Status: AC
  Filled 2018-05-14: qty 40

## 2018-05-14 MED ORDER — PROPOFOL 10 MG/ML IV BOLUS
INTRAVENOUS | Status: DC | PRN
  Administered 2018-05-14: 20 mg via INTRAVENOUS
  Administered 2018-05-14: 30 mg via INTRAVENOUS

## 2018-05-14 MED ORDER — EPHEDRINE SULFATE 50 MG/ML IJ SOLN
INTRAMUSCULAR | Status: DC | PRN
  Administered 2018-05-14 (×3): 5 mg via INTRAVENOUS

## 2018-05-14 MED ORDER — CHLORHEXIDINE GLUCONATE CLOTH 2 % EX PADS: 6.0000 | MEDICATED_PAD | Freq: Once | CUTANEOUS | Status: DC

## 2018-05-14 MED ORDER — MIDAZOLAM HCL 2 MG/2ML IJ SOLN
INTRAMUSCULAR | Status: AC
  Filled 2018-05-14: qty 2

## 2018-05-14 MED ORDER — LACTATED RINGERS IV SOLN
INTRAVENOUS | Status: DC | PRN
  Administered 2018-05-14: 07:00:00 via INTRAVENOUS

## 2018-05-14 NOTE — Interval H&P Note (Signed)
History and Physical Interval Note:  05/14/2018 7:12 AM  Peter Levy  has presented today for surgery, with the diagnosis of screening  The various methods of treatment have been discussed with the patient and family. After consideration of risks, benefits and other options for treatment, the patient has consented to  Procedure(s): COLONOSCOPY WITH PROPOFOL (N/A) as a surgical intervention .  The patient's history has been reviewed, patient examined, no change in status, stable for surgery.  I have reviewed the patient's chart and labs.  Questions were answered to the patient's satisfaction.     [name]

## 2018-05-14 NOTE — Transfer of Care (Signed)
Immediate Anesthesia Transfer of Care Note  Patient: Peter Levy  Procedure(s) Performed: FLEXIBLE SIGMOIDOSCOPY (N/A )  Patient Location: PACU  Anesthesia Type:MAC  Level of Consciousness: awake, alert  and oriented  Airway & Oxygen Therapy: Patient Spontanous Breathing  Post-op Assessment: Report given to RN  Post vital signs: Reviewed  Last Vitals:  Vitals Value Taken Time  BP    Temp    Pulse 114 05/14/2018  8:15 AM  Resp 26 05/14/2018  8:15 AM  SpO2 95 % 05/14/2018  8:15 AM  Vitals shown include unvalidated device data.  Last Pain:  Vitals:   05/14/18 8127  TempSrc: Oral  PainSc: 0-No pain         Complications: No apparent anesthesia complications

## 2018-05-14 NOTE — Anesthesia Postprocedure Evaluation (Signed)
Anesthesia Post Note  Patient: Peter Levy  Procedure(s) Performed: FLEXIBLE SIGMOIDOSCOPY (N/A )  Patient location during evaluation: PACU Anesthesia Type: General Level of consciousness: awake and alert and oriented Pain management: pain level controlled Vital Signs Assessment: post-procedure vital signs reviewed and stable Respiratory status: spontaneous breathing Cardiovascular status: blood pressure returned to baseline Postop Assessment: no apparent nausea or vomiting Anesthetic complications: no     Last Vitals:  Vitals:   05/14/18 0638 05/14/18 0815  BP: 125/80 106/70  Pulse: 94 (!) 114  Resp:  20  Temp: 36.8 C 36.6 C  SpO2: 94% 95%    Last Pain:  Vitals:   05/14/18 0638  TempSrc: Oral  PainSc: 0-No pain                 Soha Thorup

## 2018-05-14 NOTE — Op Note (Signed)
[facility] Patient Name: Peter Levy Procedure Date: 05/14/2018 7:09 AM MRN: [id number] Date of Birth: June 26, 1956 Attending MD: [name] , MD CSN: [id number] Age: 62 Admit Type: Outpatient Procedure:                Colonoscopy Indications:              Screening for colorectal malignant neoplasm Providers:                [name], MD, Loma Messing B. Patsy Lager, RN, Dyann Ruddle Referring MD:              Medicines:                Monitored Anesthesia Care Complications:            No immediate complications. Estimated Blood Loss:     Estimated blood loss: none. Procedure:                Pre-Anesthesia Assessment:                           - This assessment was completed prior to the                            administration of sedation.                           - Universal Protocol:                           - Pre-procedure Verification: Prior to the                            procedure, the patient's identity was verified by                            full name and date of birth. The patient's identity                            was verified on all pertinent medical records,                            including History and Physical and pre-anesthesia                            assessment. Also prior to the procedure, a History                            and Physical was performed, and patient                            medications, allergies and sensitivities were                            reviewed. The patient's tolerance of previous  anesthesia was reviewed. The risks and benefits of                            the procedure and the sedation options and risks                            were discussed with the patient. All questions were                            answered and informed consent was obtained.                           - Marking: The endoscopic procedure was visually                            marked on a  patient wrist band delineating the                            patient name, proposed procedure and endoscopist's                            initials.                           - Time-Out: Prior to the start of the procedure,                            the patient's identification, proposed procedure,                            accurate signed consent, correctly labeled images                            and records, and need for prophylactic antibiotics                            were verified by the physician, the nurse, the                            anesthetist and the technician in the endoscopy                            suite.                           After obtaining informed consent, the colonoscope                            was passed under direct vision. Throughout the                            procedure, the patient's blood pressure, pulse, and                            oxygen  saturations were monitored continuously. The                            EC-[id number] ([id number]) scope was introduced through                            the anus and advanced to the the sigmoid colon for                            evaluation. This was the intended extent. The                            colonoscopy was aborted due to significant looping.                            Changing the patient to a supine position did not                            allow for the successful completion of the                            procedure. The patient tolerated the procedure                            well. The quality of the bowel preparation was                            poor. The total duration of the procedure was 40                            minutes. Scope In: 7:29:40 AM Scope Out: 8:06:51 AM Total Procedure Duration: 0 hours 37 minutes 11 seconds  Findings:      The colonoscope could only be advanced to the distal sigmoid colon.       Evidence of volvulus present. Switched to pediatric scope, but could  not       safely pass the scope past this point. Procedure thus aborted. Impression:               - The procedure was aborted due to significant                            looping.                           - Preparation of the colon was poor.                           - No specimens collected.                           - Suspected volvulus. Moderate Sedation:      Moderate (conscious) sedation was personally administered by an       anesthesia professional. The following parameters were monitored: oxygen       saturation, heart rate, blood pressure, and response to  care. Recommendation:           - Written discharge instructions were provided to                            the patient.                           - The signs and symptoms of potential delayed                            complications were discussed with the patient.                           - Patient has a contact number available for                            emergencies.                           - Return to normal activities tomorrow.                           - Resume previous diet.                           - Continue present medications.                           - See the other procedure note for documentation of                            additional recommendations.                           - Discharge patient to a nursing home.                           - Perform a single contrast barium enema at                            appointment to be scheduled. Procedure Code(s):        --- Professional ---                           [phone], 53, Colonoscopy, flexible; diagnostic,                            including collection of specimen(s) by brushing or                            washing, when performed (separate procedure) Diagnosis Code(s):        --- Professional ---                           Z12.11, Encounter for screening for malignant  neoplasm of colon                           Z53.8,  Procedure and treatment not carried out for                            other reasons CPT copyright 2017 American [employer] Association. All rights reserved. The codes documented in this report are preliminary and upon coder review may  be revised to meet current compliance requirements. [name], MD [name], MD 05/14/2018 8:21:23 AM This report has been signed electronically. Number of Addenda: 0

## 2018-05-14 NOTE — Anesthesia Preprocedure Evaluation (Addendum)
Anesthesia Evaluation  Patient identified by MRN, date of birth, ID band Patient awake    Reviewed: Allergy & Precautions, H&P , NPO status , Patient's Chart, lab work & pertinent test results  Airway Mallampati: II  TM Distance: >3 FB Neck ROM: full    Dental no notable dental hx.    Pulmonary neg pulmonary ROS,    Pulmonary exam normal breath sounds clear to auscultation       Cardiovascular Exercise Tolerance: Good negative cardio ROS   Rhythm:regular Rate:Normal     Neuro/Psych CP (cerebral palsy) (HCC) negative neurological ROS  negative psych ROS   GI/Hepatic negative GI ROS, Neg liver ROS,   Endo/Other  negative endocrine ROS  Renal/GU negative Renal ROS  negative genitourinary   Musculoskeletal   Abdominal   Peds  Hematology negative hematology ROS (+)   Anesthesia Other Findings   Reproductive/Obstetrics negative OB ROS                             Anesthesia Physical Anesthesia Plan  ASA: III  Anesthesia Plan: MAC   Post-op Pain Management:    Induction:   PONV Risk Score and Plan:   Airway Management Planned:   Additional Equipment:   Intra-op Plan:   Post-operative Plan:   Informed Consent: I have reviewed the patients History and Physical, chart, labs and discussed the procedure including the risks, benefits and alternatives for the proposed anesthesia with the patient or authorized representative who has indicated his/her understanding and acceptance.   Dental Advisory Given  Plan Discussed with: CRNA  Anesthesia Plan Comments:        Anesthesia Quick Evaluation

## 2018-05-18 ENCOUNTER — Encounter (HOSPITAL_COMMUNITY): Payer: Self-pay | Admitting: General Surgery

## 2018-09-09 DIAGNOSIS — G803 Athetoid cerebral palsy: Secondary | ICD-10-CM | POA: Diagnosis not present

## 2018-09-09 DIAGNOSIS — E782 Mixed hyperlipidemia: Secondary | ICD-10-CM | POA: Diagnosis not present

## 2018-09-09 DIAGNOSIS — Z1389 Encounter for screening for other disorder: Secondary | ICD-10-CM | POA: Diagnosis not present

## 2018-09-09 DIAGNOSIS — Z23 Encounter for immunization: Secondary | ICD-10-CM | POA: Diagnosis not present

## 2018-09-09 DIAGNOSIS — F73 Profound intellectual disabilities: Secondary | ICD-10-CM | POA: Diagnosis not present

## 2018-09-09 DIAGNOSIS — Z6830 Body mass index (BMI) 30.0-30.9, adult: Secondary | ICD-10-CM | POA: Diagnosis not present

## 2018-11-23 DIAGNOSIS — J22 Unspecified acute lower respiratory infection: Secondary | ICD-10-CM | POA: Diagnosis not present

## 2018-11-23 DIAGNOSIS — E6609 Other obesity due to excess calories: Secondary | ICD-10-CM | POA: Diagnosis not present

## 2018-11-23 DIAGNOSIS — Z6831 Body mass index (BMI) 31.0-31.9, adult: Secondary | ICD-10-CM | POA: Diagnosis not present

## 2018-11-23 DIAGNOSIS — J029 Acute pharyngitis, unspecified: Secondary | ICD-10-CM | POA: Diagnosis not present

## 2018-12-16 DIAGNOSIS — B351 Tinea unguium: Secondary | ICD-10-CM | POA: Diagnosis not present

## 2018-12-16 DIAGNOSIS — G809 Cerebral palsy, unspecified: Secondary | ICD-10-CM | POA: Diagnosis not present

## 2018-12-16 DIAGNOSIS — F73 Profound intellectual disabilities: Secondary | ICD-10-CM | POA: Diagnosis not present

## 2018-12-16 DIAGNOSIS — Z6830 Body mass index (BMI) 30.0-30.9, adult: Secondary | ICD-10-CM | POA: Diagnosis not present

## 2019-01-04 DIAGNOSIS — I739 Peripheral vascular disease, unspecified: Secondary | ICD-10-CM | POA: Diagnosis not present

## 2019-01-04 DIAGNOSIS — M79671 Pain in right foot: Secondary | ICD-10-CM | POA: Diagnosis not present

## 2019-01-04 DIAGNOSIS — M79672 Pain in left foot: Secondary | ICD-10-CM | POA: Diagnosis not present

## 2019-03-29 DIAGNOSIS — Z681 Body mass index (BMI) 19 or less, adult: Secondary | ICD-10-CM | POA: Diagnosis not present

## 2019-03-29 DIAGNOSIS — J329 Chronic sinusitis, unspecified: Secondary | ICD-10-CM | POA: Diagnosis not present

## 2019-06-02 DIAGNOSIS — M79675 Pain in left toe(s): Secondary | ICD-10-CM | POA: Diagnosis not present

## 2019-06-02 DIAGNOSIS — M79671 Pain in right foot: Secondary | ICD-10-CM | POA: Diagnosis not present

## 2019-06-02 DIAGNOSIS — I739 Peripheral vascular disease, unspecified: Secondary | ICD-10-CM | POA: Diagnosis not present

## 2019-06-02 DIAGNOSIS — M79672 Pain in left foot: Secondary | ICD-10-CM | POA: Diagnosis not present

## 2019-06-02 DIAGNOSIS — M79674 Pain in right toe(s): Secondary | ICD-10-CM | POA: Diagnosis not present

## 2019-06-27 DIAGNOSIS — Z1389 Encounter for screening for other disorder: Secondary | ICD-10-CM | POA: Diagnosis not present

## 2019-06-27 DIAGNOSIS — Z0001 Encounter for general adult medical examination with abnormal findings: Secondary | ICD-10-CM | POA: Diagnosis not present

## 2019-06-27 DIAGNOSIS — Z681 Body mass index (BMI) 19 or less, adult: Secondary | ICD-10-CM | POA: Diagnosis not present

## 2019-08-29 DIAGNOSIS — I739 Peripheral vascular disease, unspecified: Secondary | ICD-10-CM | POA: Diagnosis not present

## 2019-08-29 DIAGNOSIS — L110 Acquired keratosis follicularis: Secondary | ICD-10-CM | POA: Diagnosis not present

## 2019-08-29 DIAGNOSIS — M79671 Pain in right foot: Secondary | ICD-10-CM | POA: Diagnosis not present

## 2019-08-29 DIAGNOSIS — M79672 Pain in left foot: Secondary | ICD-10-CM | POA: Diagnosis not present

## 2019-09-08 DIAGNOSIS — Z23 Encounter for immunization: Secondary | ICD-10-CM | POA: Diagnosis not present

## 2019-10-18 DIAGNOSIS — R05 Cough: Secondary | ICD-10-CM | POA: Diagnosis not present

## 2019-10-26 DIAGNOSIS — J069 Acute upper respiratory infection, unspecified: Secondary | ICD-10-CM | POA: Diagnosis not present

## 2019-10-26 DIAGNOSIS — Z681 Body mass index (BMI) 19 or less, adult: Secondary | ICD-10-CM | POA: Diagnosis not present

## 2019-10-26 DIAGNOSIS — J209 Acute bronchitis, unspecified: Secondary | ICD-10-CM | POA: Diagnosis not present

## 2019-10-28 DIAGNOSIS — Z9189 Other specified personal risk factors, not elsewhere classified: Secondary | ICD-10-CM | POA: Diagnosis not present

## 2019-12-22 DIAGNOSIS — L110 Acquired keratosis follicularis: Secondary | ICD-10-CM | POA: Diagnosis not present

## 2019-12-22 DIAGNOSIS — I739 Peripheral vascular disease, unspecified: Secondary | ICD-10-CM | POA: Diagnosis not present

## 2019-12-22 DIAGNOSIS — M79672 Pain in left foot: Secondary | ICD-10-CM | POA: Diagnosis not present

## 2019-12-22 DIAGNOSIS — M79671 Pain in right foot: Secondary | ICD-10-CM | POA: Diagnosis not present

## 2020-01-10 DIAGNOSIS — J683 Other acute and subacute respiratory conditions due to chemicals, gases, fumes and vapors: Secondary | ICD-10-CM | POA: Diagnosis not present

## 2020-01-10 DIAGNOSIS — U071 COVID-19: Secondary | ICD-10-CM | POA: Diagnosis not present

## 2020-01-10 DIAGNOSIS — Z681 Body mass index (BMI) 19 or less, adult: Secondary | ICD-10-CM | POA: Diagnosis not present

## 2020-01-10 DIAGNOSIS — R062 Wheezing: Secondary | ICD-10-CM | POA: Diagnosis not present

## 2020-03-01 DIAGNOSIS — I739 Peripheral vascular disease, unspecified: Secondary | ICD-10-CM | POA: Diagnosis not present

## 2020-03-01 DIAGNOSIS — L110 Acquired keratosis follicularis: Secondary | ICD-10-CM | POA: Diagnosis not present

## 2020-03-01 DIAGNOSIS — M79672 Pain in left foot: Secondary | ICD-10-CM | POA: Diagnosis not present

## 2020-03-01 DIAGNOSIS — M79671 Pain in right foot: Secondary | ICD-10-CM | POA: Diagnosis not present

## 2020-05-24 DIAGNOSIS — L110 Acquired keratosis follicularis: Secondary | ICD-10-CM | POA: Diagnosis not present

## 2020-05-24 DIAGNOSIS — M79672 Pain in left foot: Secondary | ICD-10-CM | POA: Diagnosis not present

## 2020-05-24 DIAGNOSIS — I739 Peripheral vascular disease, unspecified: Secondary | ICD-10-CM | POA: Diagnosis not present

## 2020-05-24 DIAGNOSIS — M79671 Pain in right foot: Secondary | ICD-10-CM | POA: Diagnosis not present

## 2020-07-04 DIAGNOSIS — G809 Cerebral palsy, unspecified: Secondary | ICD-10-CM | POA: Diagnosis not present

## 2020-07-04 DIAGNOSIS — F73 Profound intellectual disabilities: Secondary | ICD-10-CM | POA: Diagnosis not present

## 2020-07-04 DIAGNOSIS — Z0001 Encounter for general adult medical examination with abnormal findings: Secondary | ICD-10-CM | POA: Diagnosis not present

## 2020-07-04 DIAGNOSIS — E7849 Other hyperlipidemia: Secondary | ICD-10-CM | POA: Diagnosis not present

## 2020-07-04 DIAGNOSIS — Z125 Encounter for screening for malignant neoplasm of prostate: Secondary | ICD-10-CM | POA: Diagnosis not present

## 2020-07-04 DIAGNOSIS — Z6831 Body mass index (BMI) 31.0-31.9, adult: Secondary | ICD-10-CM | POA: Diagnosis not present

## 2020-07-04 DIAGNOSIS — E039 Hypothyroidism, unspecified: Secondary | ICD-10-CM | POA: Diagnosis not present

## 2020-07-04 DIAGNOSIS — E782 Mixed hyperlipidemia: Secondary | ICD-10-CM | POA: Diagnosis not present

## 2020-07-04 DIAGNOSIS — J683 Other acute and subacute respiratory conditions due to chemicals, gases, fumes and vapors: Secondary | ICD-10-CM | POA: Diagnosis not present

## 2020-07-04 DIAGNOSIS — Z1389 Encounter for screening for other disorder: Secondary | ICD-10-CM | POA: Diagnosis not present

## 2020-08-13 DIAGNOSIS — M79672 Pain in left foot: Secondary | ICD-10-CM | POA: Diagnosis not present

## 2020-08-13 DIAGNOSIS — L110 Acquired keratosis follicularis: Secondary | ICD-10-CM | POA: Diagnosis not present

## 2020-08-13 DIAGNOSIS — I739 Peripheral vascular disease, unspecified: Secondary | ICD-10-CM | POA: Diagnosis not present

## 2020-08-13 DIAGNOSIS — M79671 Pain in right foot: Secondary | ICD-10-CM | POA: Diagnosis not present

## 2020-12-11 DIAGNOSIS — M79671 Pain in right foot: Secondary | ICD-10-CM | POA: Diagnosis not present

## 2020-12-11 DIAGNOSIS — I739 Peripheral vascular disease, unspecified: Secondary | ICD-10-CM | POA: Diagnosis not present

## 2020-12-11 DIAGNOSIS — M79672 Pain in left foot: Secondary | ICD-10-CM | POA: Diagnosis not present

## 2020-12-11 DIAGNOSIS — L110 Acquired keratosis follicularis: Secondary | ICD-10-CM | POA: Diagnosis not present

## 2021-02-07 DIAGNOSIS — L609 Nail disorder, unspecified: Secondary | ICD-10-CM | POA: Diagnosis not present

## 2021-02-07 DIAGNOSIS — M79674 Pain in right toe(s): Secondary | ICD-10-CM | POA: Diagnosis not present

## 2021-02-07 DIAGNOSIS — M79671 Pain in right foot: Secondary | ICD-10-CM | POA: Diagnosis not present

## 2021-02-07 DIAGNOSIS — L600 Ingrowing nail: Secondary | ICD-10-CM | POA: Diagnosis not present

## 2021-03-06 DIAGNOSIS — M79671 Pain in right foot: Secondary | ICD-10-CM | POA: Diagnosis not present

## 2021-03-06 DIAGNOSIS — L110 Acquired keratosis follicularis: Secondary | ICD-10-CM | POA: Diagnosis not present

## 2021-03-06 DIAGNOSIS — I739 Peripheral vascular disease, unspecified: Secondary | ICD-10-CM | POA: Diagnosis not present

## 2021-03-06 DIAGNOSIS — M79672 Pain in left foot: Secondary | ICD-10-CM | POA: Diagnosis not present

## 2021-08-26 ENCOUNTER — Encounter (HOSPITAL_COMMUNITY): Payer: Self-pay | Admitting: *Deleted

## 2021-08-26 ENCOUNTER — Observation Stay (HOSPITAL_COMMUNITY)
Admission: EM | Admit: 2021-08-26 | Discharge: 2021-08-28 | Disposition: A | Discharge status: Home / Self Care | Payer: Medicare Other | Attending: Family Medicine | Admitting: Family Medicine

## 2021-08-26 ENCOUNTER — Emergency Department (HOSPITAL_COMMUNITY): Payer: Medicare Other

## 2021-08-26 ENCOUNTER — Other Ambulatory Visit: Payer: Self-pay

## 2021-08-26 DIAGNOSIS — R9431 Abnormal electrocardiogram [ECG] [EKG]: Secondary | ICD-10-CM | POA: Diagnosis not present

## 2021-08-26 DIAGNOSIS — G809 Cerebral palsy, unspecified: Secondary | ICD-10-CM | POA: Diagnosis not present

## 2021-08-26 DIAGNOSIS — R7989 Other specified abnormal findings of blood chemistry: Secondary | ICD-10-CM | POA: Insufficient documentation

## 2021-08-26 DIAGNOSIS — Z79899 Other long term (current) drug therapy: Secondary | ICD-10-CM | POA: Diagnosis not present

## 2021-08-26 DIAGNOSIS — G249 Dystonia, unspecified: Secondary | ICD-10-CM | POA: Insufficient documentation

## 2021-08-26 DIAGNOSIS — R5381 Other malaise: Secondary | ICD-10-CM | POA: Diagnosis not present

## 2021-08-26 DIAGNOSIS — Z20822 Contact with and (suspected) exposure to covid-19: Secondary | ICD-10-CM | POA: Diagnosis not present

## 2021-08-26 DIAGNOSIS — M62830 Muscle spasm of back: Secondary | ICD-10-CM | POA: Diagnosis present

## 2021-08-26 DIAGNOSIS — Z23 Encounter for immunization: Secondary | ICD-10-CM | POA: Insufficient documentation

## 2021-08-26 DIAGNOSIS — R0602 Shortness of breath: Secondary | ICD-10-CM | POA: Diagnosis not present

## 2021-08-26 DIAGNOSIS — R404 Transient alteration of awareness: Secondary | ICD-10-CM | POA: Diagnosis not present

## 2021-08-26 DIAGNOSIS — R61 Generalized hyperhidrosis: Secondary | ICD-10-CM | POA: Diagnosis not present

## 2021-08-26 DIAGNOSIS — R062 Wheezing: Secondary | ICD-10-CM | POA: Diagnosis not present

## 2021-08-26 DIAGNOSIS — M6282 Rhabdomyolysis: Principal | ICD-10-CM | POA: Diagnosis present

## 2021-08-26 DIAGNOSIS — R059 Cough, unspecified: Secondary | ICD-10-CM | POA: Diagnosis not present

## 2021-08-26 DIAGNOSIS — R000 Tachycardia, unspecified: Secondary | ICD-10-CM | POA: Diagnosis not present

## 2021-08-26 DIAGNOSIS — R0600 Dyspnea, unspecified: Secondary | ICD-10-CM | POA: Diagnosis not present

## 2021-08-26 DIAGNOSIS — R52 Pain, unspecified: Secondary | ICD-10-CM | POA: Diagnosis not present

## 2021-08-26 LAB — COMPREHENSIVE METABOLIC PANEL
ALT: 31 U/L (ref 0–44)
AST: 44 U/L — ABNORMAL HIGH (ref 15–41)
Albumin: 4.3 g/dL (ref 3.5–5.0)
Alkaline Phosphatase: 43 U/L (ref 38–126)
Anion gap: 11 (ref 5–15)
BUN: 17 mg/dL (ref 8–23)
CO2: 20 mmol/L — ABNORMAL LOW (ref 22–32)
Calcium: 9.2 mg/dL (ref 8.9–10.3)
Chloride: 109 mmol/L (ref 98–111)
Creatinine, Ser: 1.18 mg/dL (ref 0.61–1.24)
GFR, Estimated: >60 mL/min (ref >60)
Glucose, Bld: 111 mg/dL — ABNORMAL HIGH (ref 70–99)
Potassium: 3.8 mmol/L (ref 3.5–5.1)
Sodium: 140 mmol/L (ref 135–145)
Total Bilirubin: 0.8 mg/dL (ref 0.3–1.2)
Total Protein: 8.3 g/dL — ABNORMAL HIGH (ref 6.5–8.1)

## 2021-08-26 LAB — CBC WITH DIFFERENTIAL/PLATELET
Abs Immature Granulocytes: 0.01 10*3/uL (ref 0.00–0.07)
Basophils Absolute: 0 10*3/uL (ref 0.0–0.1)
Basophils Relative: 1 %
Eosinophils Absolute: 0 10*3/uL (ref 0.0–0.5)
Eosinophils Relative: 1 %
HCT: 45.2 % (ref 39.0–52.0)
Hemoglobin: 14.8 g/dL (ref 13.0–17.0)
Immature Granulocytes: 0 %
Lymphocytes Relative: 36 %
Lymphs Abs: 2.4 10*3/uL (ref 0.7–4.0)
MCH: 31.1 pg (ref 26.0–34.0)
MCHC: 32.7 g/dL (ref 30.0–36.0)
MCV: 95 fL (ref 80.0–100.0)
Monocytes Absolute: 0.7 10*3/uL (ref 0.1–1.0)
Monocytes Relative: 11 %
Neutro Abs: 3.4 10*3/uL (ref 1.7–7.7)
Neutrophils Relative %: 51 %
Platelets: 261 10*3/uL (ref 150–400)
RBC: 4.76 MIL/uL (ref 4.22–5.81)
RDW: 12.4 % (ref 11.5–15.5)
WBC: 6.6 10*3/uL (ref 4.0–10.5)
nRBC: 0 % (ref 0.0–0.2)

## 2021-08-26 LAB — RESP PANEL BY RT-PCR (FLU A&B, COVID) ARPGX2
Influenza A by PCR: NEGATIVE
Influenza B by PCR: NEGATIVE
SARS Coronavirus 2 by RT PCR: NEGATIVE

## 2021-08-26 LAB — MAGNESIUM: Magnesium: 2 mg/dL (ref 1.7–2.4)

## 2021-08-26 LAB — TROPONIN I (HIGH SENSITIVITY): Troponin I (High Sensitivity): 4 ng/L (ref <18)

## 2021-08-26 LAB — CBG MONITORING, ED: Glucose-Capillary: 106 mg/dL — ABNORMAL HIGH (ref 70–99)

## 2021-08-26 LAB — CK: Total CK: 1029 U/L — ABNORMAL HIGH (ref 49–397)

## 2021-08-26 MED ORDER — DIPHENHYDRAMINE HCL 50 MG/ML IJ SOLN: 50.0000 mg | Freq: Once | INTRAMUSCULAR | Status: DC

## 2021-08-26 MED ORDER — DIPHENHYDRAMINE HCL 50 MG/ML IJ SOLN
50.0000 mg | Freq: Once | INTRAMUSCULAR | Status: AC
  Administered 2021-08-26: 50 mg via INTRAMUSCULAR
  Filled 2021-08-26: qty 1

## 2021-08-26 NOTE — ED Triage Notes (Addendum)
Spasms in right side onset 3 hours ago, history of cerebral palsy

## 2021-08-26 NOTE — ED Provider Notes (Signed)
[facility] EMERGENCY DEPARTMENT Provider Note   CSN: [id number] Arrival date & time: 08/26/21  1729     History Chief Complaint  Patient presents with   Flank Pain    Peter Levy is a 65 y.o. male with history of cerebral palsy presenting with uncontrolled spasms and complaint of muscle pain.  He does have a mild occasional tremor, but today has had worsening spasms in his extremities and his trunk with any significant attempts at movement.    Mother (per phone) and caregiver (owner of group home at bedside)  states he has also had sweating today along with breathing problems, stating he has been wheezing today, and turning "beat red" in the face with exertion.  He denies chest pain, abdominal pain, n/v, diarrhea.    The history is provided by the patient, a friend and a parent.      Past Medical History:  Diagnosis Date   CP (cerebral palsy) [facility])     Patient Active Problem List   Diagnosis Date Noted   Special screening for malignant neoplasms, colon    Failed attempted surgical procedure     Past Surgical History:  Procedure Laterality Date   COLONOSCOPY     FLEXIBLE SIGMOIDOSCOPY N/A 05/14/2018   Procedure: FLEXIBLE SIGMOIDOSCOPY;  Surgeon: [name], MD;  Location: [facility] ENDO SUITE;  Service: Gastroenterology;  Laterality: N/A;   none     none       No family history on file.  Social History   Tobacco Use   Smoking status: Never   Smokeless tobacco: Never  Vaping Use   Vaping Use: Never used  Substance Use Topics   Alcohol use: No   Drug use: No    Home Medications Prior to Admission medications   Medication Sig Start Date End Date Taking? Authorizing Provider  albuterol (PROVENTIL) 4 MG tablet Take by mouth. 03/11/21  Yes [provider]  benzonatate (TESSALON) 200 MG capsule Take by mouth. 03/11/21  Yes [provider]  busPIRone (BUSPAR) 5 MG tablet Take by mouth daily as needed. 07/26/21  Yes [provider]     Allergies    Patient has no known allergies.  Review of Systems   Review of Systems  HENT: Negative.    Respiratory:  Positive for cough and shortness of breath.   Cardiovascular: Negative.  Negative for chest pain.  Gastrointestinal: Negative.   Musculoskeletal:  Positive for myalgias.  Skin: Negative.   Neurological:  Positive for tremors.  All other systems reviewed and are negative.  Physical Exam Updated Vital Signs BP 139/82   Pulse (!) 119   Temp 99.2 F (37.3 C)   Resp 20   SpO2 93%   Physical Exam Vitals and nursing note reviewed.  Constitutional:      Appearance: He is well-developed.  HENT:     Head: Normocephalic and atraumatic.  Eyes:     Conjunctiva/sclera: Conjunctivae normal.  Cardiovascular:     Rate and Rhythm: Normal rate and regular rhythm.     Heart sounds: Normal heart sounds.  Pulmonary:     Effort: Pulmonary effort is normal.     Breath sounds: Normal breath sounds. No wheezing.  Abdominal:     General: Bowel sounds are normal.     Palpations: Abdomen is soft.     Tenderness: There is no abdominal tenderness.  Musculoskeletal:        General: Normal range of motion.     Cervical back: Normal range  of motion.  Skin:    General: Skin is warm and dry.  Neurological:     Mental Status: He is alert. Mental status is at baseline.     Cranial Nerves: Cranial nerves are intact.     Motor: Tremor present. No seizure activity.     Deep Tendon Reflexes:     Reflex Scores:      Bicep reflexes are 3+ on the right side and 3+ on the left side.    Comments: Dyskinesia present, randomly at rest, worsens with active movements. Gait not assessed.  Unable to sit up or get out of bed without uncontrolled dyskinesia.     ED Results / Procedures / Treatments   Labs (all labs ordered are listed, but only abnormal results are displayed) Labs Reviewed  COMPREHENSIVE METABOLIC PANEL - Abnormal; Notable for the following components:      Result Value    CO2 20 (*)    Glucose, Bld 111 (*)    Total Protein 8.3 (*)    AST 44 (*)    All other components within normal limits  CK - Abnormal; Notable for the following components:   Total CK 1,029 (*)    All other components within normal limits  CBG MONITORING, ED - Abnormal; Notable for the following components:   Glucose-Capillary 106 (*)    All other components within normal limits  RESP PANEL BY RT-PCR (FLU A&B, COVID) ARPGX2  CBC WITH DIFFERENTIAL/PLATELET  MAGNESIUM  TROPONIN I (HIGH SENSITIVITY)  TROPONIN I (HIGH SENSITIVITY)    EKG EKG Interpretation  Date/Time:  Monday August 26 2021 21:52:39 EDT Ventricular Rate:  117 PR Interval:  137 QRS Duration: 89 QT Interval:  327 QTC Calculation: 457 R Axis:   50 Text Interpretation: Sinus tachycardia Ventricular premature complex Baseline wander in lead(s) II III aVR aVL aVF V6 Confirmed by Gilda Crease ([id number]) on 08/27/2021 12:26:32 AM  Radiology DG Chest Port 1 View  Result Date: 08/26/2021 CLINICAL DATA:  Dyspnea, cough, wheezing EXAM: PORTABLE CHEST 1 VIEW COMPARISON:  01/26/2017 FINDINGS: Stable elevation of the left hemidiaphragm. Lungs are clear. No pneumothorax or pleural effusion. Cardiac size within normal limits. Pulmonary vascularity is normal. No acute bone abnormality. IMPRESSION: No radiographic evidence of acute cardiopulmonary disease. Electronically Signed   By: Helyn Numbers M.D.   On: 08/26/2021 21:54    Procedures Procedures   Medications Ordered in ED Medications  LORazepam (ATIVAN) injection 0.5 mg (has no administration in time range)  diphenhydrAMINE (BENADRYL) injection 50 mg (50 mg Intramuscular Given 08/26/21 2333)    ED Course  I have reviewed the triage vital signs and the nursing notes.  Pertinent labs & imaging results that were available during my care of the patient were reviewed by me and considered in my medical decision making (see chart for details).    MDM  Rules/Calculators/A&P                           Pt with several complaints including uncontrolled movements, exam, diaphoresis, tachycardia suggests serotonin syndrome.  He is on buspar, however states he has been on this medicine for years, no recent changes in this medication. Denies taking more than his prescribed dose.  Labs including electrolytes are stable. He does however have an elevated total CK at 1029, kidney function is ok.  He was given IM dose of bendryl 50 mg, 50 minutes later, still having involuntary movements with any attempts to  move arms or sit up.   IV started to give lorazapam.  Fluids ordered.  High risk pt due to CP with suspected serotonin syndrome, mild rhabdomyolysis without any signs of kidney injury.  Pt would benefit from admission/observation.   Discussed with Dr. Thomes Dinning who accepts pt for admission. Final Clinical Impression(s) / ED Diagnoses Final diagnoses:  Non-traumatic rhabdomyolysis  Dystonia    Rx / DC Orders ED Discharge Orders     None        Victoriano Lain 08/27/21 0106    Bethann Berkshire, MD 09/03/21 1706

## 2021-08-27 ENCOUNTER — Encounter (HOSPITAL_COMMUNITY): Payer: Self-pay | Admitting: Internal Medicine

## 2021-08-27 DIAGNOSIS — G809 Cerebral palsy, unspecified: Secondary | ICD-10-CM | POA: Diagnosis not present

## 2021-08-27 DIAGNOSIS — M6282 Rhabdomyolysis: Secondary | ICD-10-CM | POA: Diagnosis not present

## 2021-08-27 LAB — CBC
HCT: 42.3 % (ref 39.0–52.0)
Hemoglobin: 14 g/dL (ref 13.0–17.0)
MCH: 30.6 pg (ref 26.0–34.0)
MCHC: 33.1 g/dL (ref 30.0–36.0)
MCV: 92.6 fL (ref 80.0–100.0)
Platelets: 231 10*3/uL (ref 150–400)
RBC: 4.57 MIL/uL (ref 4.22–5.81)
RDW: 12.3 % (ref 11.5–15.5)
WBC: 5.4 10*3/uL (ref 4.0–10.5)
nRBC: 0 % (ref 0.0–0.2)

## 2021-08-27 LAB — HIV ANTIBODY (ROUTINE TESTING W REFLEX): HIV Screen 4th Generation wRfx: NONREACTIVE

## 2021-08-27 LAB — CREATININE, SERUM
Creatinine, Ser: 1.03 mg/dL (ref 0.61–1.24)
GFR, Estimated: >60 mL/min (ref >60)

## 2021-08-27 LAB — TROPONIN I (HIGH SENSITIVITY): Troponin I (High Sensitivity): 5 ng/L (ref <18)

## 2021-08-27 MED ORDER — SODIUM CHLORIDE 0.9 % IV BOLUS
1000.0000 mL | Freq: Once | INTRAVENOUS | Status: AC
  Administered 2021-08-27: 1000 mL via INTRAVENOUS

## 2021-08-27 MED ORDER — MOMETASONE FURO-FORMOTEROL FUM 200-5 MCG/ACT IN AERO
2.0000 | INHALATION_SPRAY | Freq: Two times a day (BID) | RESPIRATORY_TRACT | Status: DC
  Administered 2021-08-27 – 2021-08-28 (×2): 2 via RESPIRATORY_TRACT
  Filled 2021-08-27: qty 8.8

## 2021-08-27 MED ORDER — DIPHENHYDRAMINE HCL 50 MG/ML IJ SOLN
25.0000 mg | Freq: Every day | INTRAMUSCULAR | Status: AC
  Administered 2021-08-27: 25 mg via INTRAVENOUS
  Filled 2021-08-27: qty 1

## 2021-08-27 MED ORDER — DIPHENHYDRAMINE HCL 50 MG/ML IJ SOLN
25.0000 mg | Freq: Once | INTRAMUSCULAR | Status: AC
  Administered 2021-08-27: 25 mg via INTRAVENOUS
  Filled 2021-08-27: qty 1

## 2021-08-27 MED ORDER — INFLUENZA VAC A&B SA ADJ QUAD 0.5 ML IM PRSY
0.5000 mL | PREFILLED_SYRINGE | INTRAMUSCULAR | Status: AC
  Administered 2021-08-28: 0.5 mL via INTRAMUSCULAR
  Filled 2021-08-27: qty 0.5

## 2021-08-27 MED ORDER — SODIUM CHLORIDE 0.9 % IV SOLN: INTRAVENOUS | Status: DC

## 2021-08-27 MED ORDER — LORAZEPAM 2 MG/ML IJ SOLN
0.5000 mg | Freq: Once | INTRAMUSCULAR | Status: AC
  Administered 2021-08-27: 0.5 mg via INTRAVENOUS
  Filled 2021-08-27: qty 1

## 2021-08-27 MED ORDER — BUSPIRONE HCL 5 MG PO TABS: 5.0000 mg | ORAL_TABLET | Freq: Every day | ORAL | Status: DC | PRN

## 2021-08-27 MED ORDER — ENOXAPARIN SODIUM 40 MG/0.4ML IJ SOSY
40.0000 mg | PREFILLED_SYRINGE | INTRAMUSCULAR | Status: DC
  Administered 2021-08-27: 40 mg via SUBCUTANEOUS
  Filled 2021-08-27: qty 0.4

## 2021-08-27 MED ORDER — PREDNISONE 20 MG PO TABS
50.0000 mg | ORAL_TABLET | Freq: Every day | ORAL | Status: DC
  Administered 2021-08-27 – 2021-08-28 (×2): 50 mg via ORAL
  Filled 2021-08-27 (×2): qty 3

## 2021-08-27 NOTE — H&P (Signed)
History and Physical  Peter Levy HER:[id number] DOB: 01-08-1956 DOA: 08/26/2021  Referring physician: [name], PA-C  PCP: [name], MD  Patient coming from: Home  Chief Complaint: Flank pain  HPI: Peter Levy is a 65 y.o. male with medical history significant for cerebral palsy resents to the emergency department due to 55-year-old onset of right-sided involuntary spasms with subsequent complaint of muscle pain.  Patient was unable to provide history, history was obtained from ED PA and ED medical record.  Per report, patient usually have intermittent spasms at baseline but usually resolve within a very short time, however, spasm today was worse due to trunk and extremity spasms.  Patient was reported to have had some and sweating today.  Patient denies chest pain, fever, chills, nausea, vomiting or abdominal pain.  ED Course:  In the emergency department, he was hemodynamically stable.  Work-up in the ED showed normal CBC and BMP except for bicarb of 20 and CBG at 111.  Magnesium was normal, troponin x2 was negative, total CK was 1029.  Influenza A, B, SARS coronavirus 2 was negative. Chest x-ray showed no radiographic evidence of acute cardiopulmonary disease Benadryl was given, Ativan was given and patient was provided with IV hydration.  Hospitalist was asked to admit patient for further evaluation and management.  Review of Systems: Constitutional: Negative for chills and fever.  HENT: Negative for ear pain and sore throat.   Eyes: Negative for pain and visual disturbance.  Respiratory: Negative for cough, chest tightness and shortness of breath.   Cardiovascular: Negative for chest pain and palpitations.  Gastrointestinal: Negative for abdominal pain and vomiting.  Endocrine: Negative for polyphagia and polyuria.  Genitourinary: Negative for decreased urine volume, dysuria, enuresis Musculoskeletal: Positive for muscle pain.  Negative for arthralgias and back pain.   Skin: Negative for color change and rash.  Allergic/Immunologic: Negative for immunocompromised state.  Neurological: Negative for tremors, syncope, speech difficulty Hematological: Does not bruise/bleed easily.  All other systems reviewed and are negative   Past Medical History:  Diagnosis Date   CP (cerebral palsy) (HCC)    Past Surgical History:  Procedure Laterality Date   COLONOSCOPY     FLEXIBLE SIGMOIDOSCOPY N/A 05/14/2018   Procedure: FLEXIBLE SIGMOIDOSCOPY;  Surgeon: [name], MD;  Location: [facility];  Service: Gastroenterology;  Laterality: N/A;   none     none    Social History:  reports that he has never smoked. He has never used smokeless tobacco. He reports that he does not drink alcohol and does not use drugs.   No Known Allergies  History reviewed. No pertinent family history.   Prior to Admission medications   Medication Sig Start Date End Date Taking? Authorizing Provider  albuterol (PROVENTIL) 4 MG tablet Take by mouth. 03/11/21  Yes [provider]  benzonatate (TESSALON) 200 MG capsule Take by mouth. 03/11/21  Yes [provider]  busPIRone (BUSPAR) 5 MG tablet Take by mouth daily as needed. 07/26/21  Yes [provider]    Physical Exam: BP 119/72 (BP Location: Left Arm)   Pulse 89   Temp 98.3 F (36.8 C) (Oral)   Resp 18   Wt 83.2 kg   SpO2 97%   BMI 30.52 kg/m   General: 65 y.o. year-old male well developed well nourished in no acute distress.  Alert and oriented x3. HEENT: NCAT, EOMI Neck: Supple, trachea medial Cardiovascular: Regular rate and rhythm with no rubs or gallops.  No thyromegaly or JVD  noted.  No lower extremity edema. 2/4 pulses in all 4 extremities. Respiratory: Clear to auscultation with no wheezes or rales. Good inspiratory effort. Abdomen: Soft, nontender nondistended with normal bowel sounds x4 quadrants. Muskuloskeletal: No cyanosis, clubbing or edema noted bilaterally Neuro: CN II-XII  intact, intermittent dystonia noted.  Sensation, reflexes intact Skin: No ulcerative lesions noted or rashes Psychiatry: Mood is appropriate for condition and setting          Labs on Admission:  Basic Metabolic Panel: Recent Labs  Lab 08/26/21 2220  NA 140  K 3.8  CL 109  CO2 20*  GLUCOSE 111*  BUN 17  CREATININE 1.18  CALCIUM 9.2  MG 2.0   Liver Function Tests: Recent Labs  Lab 08/26/21 2220  AST 44*  ALT 31  ALKPHOS 43  BILITOT 0.8  PROT 8.3*  ALBUMIN 4.3   No results for input(s): LIPASE, AMYLASE in the last 168 hours. No results for input(s): AMMONIA in the last 168 hours. CBC: Recent Labs  Lab 08/26/21 2220  WBC 6.6  NEUTROABS 3.4  HGB 14.8  HCT 45.2  MCV 95.0  PLT 261   Cardiac Enzymes: Recent Labs  Lab 08/26/21 2220  CKTOTAL 1,029*    BNP (last 3 results) No results for input(s): BNP in the last 8760 hours.  ProBNP (last 3 results) No results for input(s): PROBNP in the last 8760 hours.  CBG: Recent Labs  Lab 08/26/21 2233  GLUCAP 106*    Radiological Exams on Admission: DG Chest Port 1 View  Result Date: 08/26/2021 CLINICAL DATA:  Dyspnea, cough, wheezing EXAM: PORTABLE CHEST 1 VIEW COMPARISON:  01/26/2017 FINDINGS: Stable elevation of the left hemidiaphragm. Lungs are clear. No pneumothorax or pleural effusion. Cardiac size within normal limits. Pulmonary vascularity is normal. No acute bone abnormality. IMPRESSION: No radiographic evidence of acute cardiopulmonary disease. Electronically Signed   By: Helyn Numbers M.D.   On: 08/26/2021 21:54    EKG: I independently viewed the EKG done and my findings are as followed: Sinus tachycardia at a rate of 117 bpm with VPCs  Assessment/Plan Present on Admission:  Rhabdomyolysis  Principal Problem:   Rhabdomyolysis Active Problems:   Cerebral palsy (HCC)  Rhabdomyolysis Continue IV hydration Continue to monitor total CK  ?? Intermittent spasms Benadryl was given, continue to  monitor and treat accordingly  Cerebral palsy Stable  Anxiety Continue home buspirone  DVT prophylaxis: Lovenox  Code Status: Full code  Family Communication: None at bedside  Disposition Plan:  Patient is from:                        home Anticipated DC to:                   SNF or family members home Anticipated DC date:               2-3 days Anticipated DC barriers:          Patient requires inpatient management due to rhabdomyolysis requiring IV hydration    Consults called: None  Admission status: Observation    Frankey Shown MD [facility]  08/27/2021, 9:23 AM

## 2021-08-27 NOTE — TOC Initial Note (Signed)
Transition of Care [facility]) - Initial/Assessment Note    Patient Details  Name: Peter Levy MRN: [id number] Date of Birth: [birth year]  Transition of Care [facility]) CM/SW Contact:    Elliot Gault, LCSW Phone Number: 08/27/2021, 10:01 AM  Clinical Narrative:                  Pt admitted from [facility] Home. Spoke with one of pt's caregivers this AM to review dc planning. Per caregiver, pt normally independent in ambulation with use of his walker. Pt requires only minimal assist with showering and is independent in other ADLs. Caregiver asking if MD can review pt's current asthma treatment medications and see if he needs a change. She states that he is frequently wheezing and gets easily short of breath. TOC relayed this info to MD.  Anticipating possible dc tomorrow. Group home will accept pt back at dc.   TOC will follow.  Expected Discharge Plan: Group Home Barriers to Discharge: Continued Medical Work up   Patient Goals and CMS Choice Patient states their goals for this hospitalization and ongoing recovery are:: go home      Expected Discharge Plan and Services Expected Discharge Plan: Group Home In-house Referral: Clinical Social Work     Living arrangements for the past 2 months: Group Home                                      Prior Living Arrangements/Services Living arrangements for the past 2 months: Group Home Lives with:: Facility Resident Patient language and need for interpreter reviewed:: Yes Do you feel safe going back to the place where you live?: Yes      Need for Family Participation in Patient Care: No (Comment) Care giver support system in place?: Yes (comment) Current home services: DME Criminal Activity/Legal Involvement Pertinent to Current Situation/Hospitalization: No - Comment as needed  Activities of Daily Living Home Assistive Devices/Equipment: Environmental consultant (specify type) ADL Screening (condition at time of  admission) Patient's cognitive ability adequate to safely complete daily activities?: No Is the patient deaf or have difficulty hearing?: No Does the patient have difficulty seeing, even when wearing glasses/contacts?: No Does the patient have difficulty concentrating, remembering, or making decisions?: Yes Patient able to express need for assistance with ADLs?: Yes Does the patient have difficulty dressing or bathing?: Yes Independently performs ADLs?: No Communication: Independent Dressing (OT): Needs assistance Is this a change from baseline?: Pre-admission baseline Grooming: Needs assistance Is this a change from baseline?: Pre-admission baseline Feeding: Independent Bathing: Needs assistance Is this a change from baseline?: Pre-admission baseline Toileting: Needs assistance Is this a change from baseline?: Pre-admission baseline In/Out Bed: Needs assistance Is this a change from baseline?: Pre-admission baseline Walks in Home: Needs assistance Is this a change from baseline?: Pre-admission baseline Does the patient have difficulty walking or climbing stairs?: Yes Weakness of Legs: Both Weakness of Arms/Hands: Both  Permission Sought/Granted                  Emotional Assessment       Orientation: : Oriented to Self, Oriented to Place, Oriented to Situation, Oriented to  Time Alcohol / Substance Use: Not Applicable Psych Involvement: No (comment)  Admission diagnosis:  Rhabdomyolysis [M62.82] Dystonia [G24.9] Non-traumatic rhabdomyolysis [M62.82] Patient Active Problem List   Diagnosis Date Noted   Rhabdomyolysis 08/27/2021   Cerebral palsy (HCC) 08/27/2021  Special screening for malignant neoplasms, colon    Failed attempted surgical procedure    PCP:  [name], MD Pharmacy:   [facility] [place], [place] - [address] [place] [address] Phone: [phone] Fax: [phone]     Social Determinants of Health  (SDOH) Interventions    Readmission Risk Interventions No flowsheet data found.

## 2021-08-27 NOTE — Progress Notes (Signed)
Patient seen and evaluated, chart reviewed, please see EMR for updated orders. Please see full H&P dictated by admitting physician Dr. Thomes Dinning for same date of service.   Brief Summary:-  65 y.o. male with medical history significant for cerebral palsy and asthma presents from [facility] group home with concerns about involuntary muscle movements, possible myalgias and elevated CPK -Patient is a poor historian due to cerebral palsy with cognitive challenges -= History obtained from caregiver at bedside  A/p  1)Elevated CPK-- hydrate, recheck cpk   2)Mild persistent asthma per report with concerns for exacerbation--prednisone and bronchodilators as ordered  3)Intermittent Spasms/involuntary muscle movement--- much improved, Benadryl as ordered  4)Cerebral palsy----supportive care  -Total care time 37  Patient seen and evaluated, chart reviewed, please see EMR for updated orders. Please see full H&P dictated by admitting physician Dr. Thomes Dinning for same date of service.   Shon Hale, MD

## 2021-08-28 DIAGNOSIS — M6282 Rhabdomyolysis: Secondary | ICD-10-CM | POA: Diagnosis not present

## 2021-08-28 DIAGNOSIS — G809 Cerebral palsy, unspecified: Secondary | ICD-10-CM | POA: Diagnosis not present

## 2021-08-28 LAB — CK: Total CK: 937 U/L — ABNORMAL HIGH (ref 49–397)

## 2021-08-28 LAB — COMPREHENSIVE METABOLIC PANEL
ALT: 26 U/L (ref 0–44)
AST: 38 U/L (ref 15–41)
Albumin: 3.4 g/dL — ABNORMAL LOW (ref 3.5–5.0)
Alkaline Phosphatase: 38 U/L (ref 38–126)
Anion gap: 5 (ref 5–15)
BUN: 15 mg/dL (ref 8–23)
CO2: 24 mmol/L (ref 22–32)
Calcium: 8.7 mg/dL — ABNORMAL LOW (ref 8.9–10.3)
Chloride: 110 mmol/L (ref 98–111)
Creatinine, Ser: 0.93 mg/dL (ref 0.61–1.24)
GFR, Estimated: >60 mL/min (ref >60)
Glucose, Bld: 132 mg/dL — ABNORMAL HIGH (ref 70–99)
Potassium: 4.3 mmol/L (ref 3.5–5.1)
Sodium: 139 mmol/L (ref 135–145)
Total Bilirubin: 0.8 mg/dL (ref 0.3–1.2)
Total Protein: 6.8 g/dL (ref 6.5–8.1)

## 2021-08-28 LAB — CBC
HCT: 41.1 % (ref 39.0–52.0)
Hemoglobin: 13.5 g/dL (ref 13.0–17.0)
MCH: 30.8 pg (ref 26.0–34.0)
MCHC: 32.8 g/dL (ref 30.0–36.0)
MCV: 93.6 fL (ref 80.0–100.0)
Platelets: 245 10*3/uL (ref 150–400)
RBC: 4.39 MIL/uL (ref 4.22–5.81)
RDW: 12 % (ref 11.5–15.5)
WBC: 4.4 10*3/uL (ref 4.0–10.5)
nRBC: 0 % (ref 0.0–0.2)

## 2021-08-28 LAB — PHOSPHORUS: Phosphorus: 2.8 mg/dL (ref 2.5–4.6)

## 2021-08-28 LAB — MRSA NEXT GEN BY PCR, NASAL: MRSA by PCR Next Gen: NOT DETECTED

## 2021-08-28 LAB — MAGNESIUM: Magnesium: 2 mg/dL (ref 1.7–2.4)

## 2021-08-28 LAB — PROTIME-INR
INR: 1 (ref 0.8–1.2)
Prothrombin Time: 13.6 seconds (ref 11.4–15.2)

## 2021-08-28 LAB — APTT: aPTT: 27 seconds (ref 24–36)

## 2021-08-28 MED ORDER — ALBUTEROL SULFATE HFA 108 (90 BASE) MCG/ACT IN AERS: 2.0000 | INHALATION_SPRAY | RESPIRATORY_TRACT | 5 refills | Status: AC | PRN

## 2021-08-28 MED ORDER — BENZONATATE 200 MG PO CAPS: 200.0000 mg | ORAL_CAPSULE | Freq: Two times a day (BID) | ORAL | 0 refills | Status: DC | PRN

## 2021-08-28 MED ORDER — BUSPIRONE HCL 5 MG PO TABS: 5.0000 mg | ORAL_TABLET | Freq: Two times a day (BID) | ORAL | 5 refills | Status: DC

## 2021-08-28 MED ORDER — PREDNISONE 50 MG PO TABS: 50.0000 mg | ORAL_TABLET | Freq: Every day | ORAL | 0 refills | Status: DC

## 2021-08-28 MED ORDER — MOMETASONE FURO-FORMOTEROL FUM 200-5 MCG/ACT IN AERO: 2.0000 | INHALATION_SPRAY | Freq: Two times a day (BID) | RESPIRATORY_TRACT | 3 refills | Status: DC

## 2021-08-28 NOTE — Discharge Summary (Signed)
Peter Levy, is a 65 y.o. male  DOB 1956-06-30  MRN [id number].  Admission date:  08/26/2021  Admitting Physician  Frankey Shown, DO  Discharge Date:  08/28/2021   Primary MD  [name], MD  Recommendations for primary care physician for things to follow:  1)Take Dulera (Mometasone-formoterol) Aero - 2 puffs Twice a day 2)Albuterol/Ventolin inhaler - as needed for cough/shortness of breath, or Wheezing 3)Take Prednisone 50 mg daily for days 4)Repeat CBC and CMP, CPK Blood Test on Monday 09/02/2021   Admission Diagnosis  Rhabdomyolysis [M62.82] Dystonia [G24.9] Non-traumatic rhabdomyolysis [M62.82]   Discharge Diagnosis  Rhabdomyolysis [M62.82] Dystonia [G24.9] Non-traumatic rhabdomyolysis [M62.82]    Principal Problem:   Rhabdomyolysis Active Problems:   Cerebral palsy (HCC)      Past Medical History:  Diagnosis Date   CP (cerebral palsy) (HCC)     Past Surgical History:  Procedure Laterality Date   COLONOSCOPY     FLEXIBLE SIGMOIDOSCOPY N/A 05/14/2018   Procedure: FLEXIBLE SIGMOIDOSCOPY;  Surgeon: [name], MD;  Location: [facility];  Service: Gastroenterology;  Laterality: N/A;   none     none     HPI  from the history and physical done on the day of admission:    Chief Complaint: Flank pain   HPI: Peter Levy is a 65 y.o. male with medical history significant for cerebral palsy resents to the emergency department due to 6-year-old onset of right-sided involuntary spasms with subsequent complaint of muscle pain.  Patient was unable to provide history, history was obtained from ED PA and ED medical record.  Per report, patient usually have intermittent spasms at baseline but usually resolve within a very short time, however, spasm today was worse due to trunk and extremity spasms.  Patient was reported to have had some and sweating today.  Patient denies chest pain,  fever, chills, nausea, vomiting or abdominal pain.   ED Course:  In the emergency department, he was hemodynamically stable.  Work-up in the ED showed normal CBC and BMP except for bicarb of 20 and CBG at 111.  Magnesium was normal, troponin x2 was negative, total CK was 1029.  Influenza A, B, SARS coronavirus 2 was negative. Chest x-ray showed no radiographic evidence of acute cardiopulmonary disease Benadryl was given, Ativan was given and patient was provided with IV hydration.  Hospitalist was asked to admit patient for further evaluation and management.    Hospital Course:    Brief Summary:-  65 y.o. male with medical history significant for cerebral palsy and asthma presents from [facility] group home with concerns about involuntary muscle movements, possible myalgias and elevated CPK -Patient is a poor historian due to cerebral palsy with cognitive challenges -= History obtained from caregiver at bedside   A/p  1)Elevated CPK--patient was hydrated, CPK less than 1000, should improve with continued hydration, repeat CPK on Monday, 09/02/2021   2)Mild persistent asthma per report with concerns for exacerbation--prednisone and bronchodilators as ordered -Discharged on Dulera twice daily and  as needed albuterol inhaler   3)Intermittent Spasms/involuntary muscle movement--- much improved, after Benadryl    4)Cerebral palsy----supportive care  Discharge Condition: stable  Follow UP----PCP for repeat CBC, CMP and CPK on Monday, 09/03/2019   Consults obtained - Na  Diet and Activity recommendation:  As advised  Discharge Instructions    Discharge Instructions     Call MD for:  difficulty breathing, headache or visual disturbances   Complete by: As directed    Call MD for:  persistant dizziness or light-headedness   Complete by: As directed    Call MD for:  persistant nausea and vomiting   Complete by: As directed    Call MD for:  severe uncontrolled pain    Complete by: As directed    Call MD for:  temperature >100.4   Complete by: As directed    Diet - low sodium heart healthy   Complete by: As directed    Discharge instructions   Complete by: As directed    1)Take Dulera (Mometasone-formoterol) Aero - 2 puffs Twice a day 2)Albuterol/Ventolin inhaler - as needed for cough/shortness of breath, or Wheezing 3)Take Prednisone 50 mg daily for days 4)Repeat CBC and CMP, CPK Blood Test on Monday 09/02/2021   Increase activity slowly   Complete by: As directed         Discharge Medications     Allergies as of 08/28/2021   No Known Allergies      Medication List     STOP taking these medications    albuterol 4 MG tablet Commonly known as: PROVENTIL Replaced by: albuterol 108 (90 Base) MCG/ACT inhaler       TAKE these medications    albuterol 108 (90 Base) MCG/ACT inhaler Commonly known as: VENTOLIN HFA Inhale 2 puffs into the lungs every 4 (four) hours as needed for wheezing or shortness of breath. Replaces: albuterol 4 MG tablet   benzonatate 200 MG capsule Commonly known as: TESSALON Take 1 capsule (200 mg total) by mouth 2 (two) times daily as needed for cough. What changed:  how much to take when to take this reasons to take this   busPIRone 5 MG tablet Commonly known as: BUSPAR Take 1 tablet (5 mg total) by mouth 2 (two) times daily. What changed:  how much to take when to take this reasons to take this   mometasone-formoterol 200-5 MCG/ACT Aero Commonly known as: DULERA Inhale 2 puffs into the lungs 2 (two) times daily.   predniSONE 50 MG tablet Commonly known as: DELTASONE Take 1 tablet (50 mg total) by mouth daily with breakfast. Start taking on: August 29, 2021        Major procedures and Radiology Reports - PLEASE review detailed and final reports for all details, in brief -    DG Chest Port 1 View  Result Date: 08/26/2021 CLINICAL DATA:  Dyspnea, cough, wheezing EXAM: PORTABLE CHEST 1  VIEW COMPARISON:  01/26/2017 FINDINGS: Stable elevation of the left hemidiaphragm. Lungs are clear. No pneumothorax or pleural effusion. Cardiac size within normal limits. Pulmonary vascularity is normal. No acute bone abnormality. IMPRESSION: No radiographic evidence of acute cardiopulmonary disease. Electronically Signed   By: Helyn Numbers M.D.   On: 08/26/2021 21:54    Micro Results    Recent Results (from the past 240 hour(s))  Resp Panel by RT-PCR (Flu A&B, Covid) Nasopharyngeal Swab     Status: None   Collection Time: 08/26/21  9:44 PM   Specimen: Nasopharyngeal Swab; Nasopharyngeal(NP) swabs in vial  transport medium  Result Value Ref Range Status   SARS Coronavirus 2 by RT PCR NEGATIVE NEGATIVE Final    Comment: (NOTE) SARS-CoV-2 target nucleic acids are NOT DETECTED.  The SARS-CoV-2 RNA is generally detectable in upper respiratory specimens during the acute phase of infection. The lowest concentration of SARS-CoV-2 viral copies this assay can detect is 138 copies/mL. A negative result does not preclude SARS-Cov-2 infection and should not be used as the sole basis for treatment or other patient management decisions. A negative result may occur with  improper specimen collection/handling, submission of specimen other than nasopharyngeal swab, presence of viral mutation(s) within the areas targeted by this assay, and inadequate number of viral copies(<138 copies/mL). A negative result must be combined with clinical observations, patient history, and epidemiological information. The expected result is Negative.  Fact Sheet for Patients:  BloggerCourse.com  Fact Sheet for Healthcare Providers:  SeriousBroker.it  This test is no t yet approved or cleared by the [place] FDA and  has been authorized for detection and/or diagnosis of SARS-CoV-2 by FDA under an Emergency Use Authorization (EUA). This EUA will remain  in effect  (meaning this test can be used) for the duration of the COVID-19 declaration under Section 564(b)(1) of the Act, 21 U.S.C.section 360bbb-3(b)(1), unless the authorization is terminated  or revoked sooner.       Influenza A by PCR NEGATIVE NEGATIVE Final   Influenza B by PCR NEGATIVE NEGATIVE Final    Comment: (NOTE) The Xpert Xpress SARS-CoV-2/FLU/RSV plus assay is intended as an aid in the diagnosis of influenza from Nasopharyngeal swab specimens and should not be used as a sole basis for treatment. Nasal washings and aspirates are unacceptable for Xpert Xpress SARS-CoV-2/FLU/RSV testing.  Fact Sheet for Patients: BloggerCourse.com  Fact Sheet for Healthcare Providers: SeriousBroker.it  This test is not yet approved or cleared by the [place] FDA and has been authorized for detection and/or diagnosis of SARS-CoV-2 by FDA under an Emergency Use Authorization (EUA). This EUA will remain in effect (meaning this test can be used) for the duration of the COVID-19 declaration under Section 564(b)(1) of the Act, 21 U.S.C. section 360bbb-3(b)(1), unless the authorization is terminated or revoked.  Performed at [facility], [address] [place], [place] [address]   MRSA Next Gen by PCR, Nasal     Status: None   Collection Time: 08/28/21  4:25 AM   Specimen: Nasal Mucosa; Nasal Swab  Result Value Ref Range Status   MRSA by PCR Next Gen NOT DETECTED NOT DETECTED Final    Comment: (NOTE) The GeneXpert MRSA Assay (FDA approved for NASAL specimens only), is one component of a comprehensive MRSA colonization surveillance program. It is not intended to diagnose MRSA infection nor to guide or monitor treatment for MRSA infections. Test performance is not FDA approved in patients less than 25 years old. Performed at [facility], [address], [place], [place] [address]        Today   Subjective    Peter Levy today has  no new concerns  No fever  Or chills  No nausea, vomiting, diarrhea        Patient has been seen and examined prior to discharge   Objective   Blood pressure 105/62, pulse 71, temperature 97.6 F (36.4 C), temperature source Oral, resp. rate 18, weight 83.2 kg, SpO2 95 %.   Intake/Output Summary (Last 24 hours) at 08/28/2021 1205 Last data filed at 08/28/2021 0900 Gross per 24 hour  Intake 3256.06 ml  Output 900 ml  Net 2356.06 ml   Exam Gen:- Awake Alert, no acute distress  HEENT:- [place].AT, No sclera icterus Neck-Supple Neck,No JVD,.  Lungs-  CTAB , good air movement bilaterally  CV- S1, S2 normal, regular Abd-  +ve B.Sounds, Abd Soft, No tenderness,    Extremity/Skin:- No  edema,   good pulses Psych-affect is appropriate, baseline cognitive and memory deficits  Neuro-no new focal deficits, no tremors    Data Review   CBC w Diff:  Lab Results  Component Value Date   WBC 4.4 08/28/2021   HGB 13.5 08/28/2021   HCT 41.1 08/28/2021   PLT 245 08/28/2021   LYMPHOPCT 36 08/26/2021   MONOPCT 11 08/26/2021   EOSPCT 1 08/26/2021   BASOPCT 1 08/26/2021    CMP:  Lab Results  Component Value Date   NA 139 08/28/2021   K 4.3 08/28/2021   CL 110 08/28/2021   CO2 24 08/28/2021   BUN 15 08/28/2021   CREATININE 0.93 08/28/2021   PROT 6.8 08/28/2021   ALBUMIN 3.4 (L) 08/28/2021   BILITOT 0.8 08/28/2021   ALKPHOS 38 08/28/2021   AST 38 08/28/2021   ALT 26 08/28/2021  .   Total Discharge time is about 33 minutes  Shon Hale M.D on 08/28/2021 at 12:05 PM  Go to [email or web address] -  for contact info  [facility] - Office  [phone]

## 2021-08-28 NOTE — TOC Transition Note (Signed)
Transition of Care [facility]) - CM/SW Discharge Note   Patient Details  Name: Peter Levy MRN: [id number] Date of Birth: 24-Aug-1956  Transition of Care [facility]) CM/SW Contact:  Elliot Gault, LCSW Phone Number: 08/28/2021, 12:20 PM   Clinical Narrative:     Pt stable for dc per MD. Pt's caregiver from the group home is here at bedside and she will transport pt home. There are no other TOC needs for dc.  Final next level of care: Group Home Barriers to Discharge: Barriers Resolved   Patient Goals and CMS Choice Patient states their goals for this hospitalization and ongoing recovery are:: go home      Discharge Placement                       Discharge Plan and Services In-house Referral: Clinical Social Work                                   Social Determinants of Health (SDOH) Interventions     Readmission Risk Interventions No flowsheet data found.

## 2021-08-28 NOTE — Progress Notes (Signed)
Bed alarm sounding, arrived to room to find pt sitting at bottom of bed attempting to get up. Pt states, "I gotta pee! I need to go to the bathroom." Pt has intact condom cath. Pt allowed to stand with assistance and pt able to void with condom cath. Clear yellow urine noted in drainage bag. Pt able to side step x4 up towards top of bed and sit down with assist x1. Pt denies any c/o. Reminded pt to call for assistance before trying to get OOB. Call bell on pt's lap, bed alarm on for safety.

## 2021-08-28 NOTE — NC FL2 (Signed)
MEDICAID FL2 LEVEL OF CARE SCREENING TOOL     IDENTIFICATION  Patient Name: Peter Levy Birthdate: November 23, 1955 Sex: male Admission Date (Current Location): 08/26/2021  [facility] and [place] Number:  [employer] and Address:  [facility],  [address], [place] [address]      Provider Number: [id number]  Attending Physician Name and Address:  Shon Hale, MD  Relative Name and Phone Number:       Current Level of Care: Hospital Recommended Level of Care: Other (Comment) Prior Approval Number:    Date Approved/Denied:   PASRR Number:    Discharge Plan: Other (Comment) (group home)    Current Diagnoses: Patient Active Problem List   Diagnosis Date Noted   Rhabdomyolysis 08/27/2021   Cerebral palsy (HCC) 08/27/2021   Special screening for malignant neoplasms, colon    Failed attempted surgical procedure     Orientation RESPIRATION BLADDER Height & Weight     Self, Situation, Place, Time  Normal Incontinent Weight: 183 lb 6.8 oz (83.2 kg) Height:     BEHAVIORAL SYMPTOMS/MOOD NEUROLOGICAL BOWEL NUTRITION STATUS      Incontinent Diet (regular)  AMBULATORY STATUS COMMUNICATION OF NEEDS Skin   Independent (with walker) Verbally Normal                       Personal Care Assistance Level of Assistance  Bathing, Feeding, Dressing Bathing Assistance: Independent Feeding assistance: Independent Dressing Assistance: Independent     Functional Limitations Info  Sight, Hearing, Speech Sight Info: Adequate Hearing Info: Adequate Speech Info: Impaired    SPECIAL CARE FACTORS FREQUENCY                       Contractures Contractures Info: Not present    Additional Factors Info  Code Status, Allergies Code Status Info: Full Allergies Info: NKA           Current Medications (08/28/2021):  This is the current hospital active medication list Current Facility-Administered Medications  Medication Dose Route  Frequency Provider Last Rate Last Admin   0.9 %  sodium chloride infusion   Intravenous Continuous Adefeso, Oladapo, DO 100 mL/hr at 08/28/21 0013 New Bag at 08/28/21 0013   busPIRone (BUSPAR) tablet 5 mg  5 mg Oral Daily PRN Adefeso, Oladapo, DO       enoxaparin (LOVENOX) injection 40 mg  40 mg Subcutaneous Q24H Adefeso, Oladapo, DO   40 mg at 08/27/21 1013   mometasone-formoterol (DULERA) 200-5 MCG/ACT inhaler 2 puff  2 puff Inhalation BID Shon Hale, MD   2 puff at 08/28/21 0942   predniSONE (DELTASONE) tablet 50 mg  50 mg Oral Q breakfast Mariea Clonts, [place], MD   50 mg at 08/28/21 0857     Discharge Medications:  STOP taking these medications     albuterol 4 MG tablet Commonly known as: PROVENTIL Replaced by: albuterol 108 (90 Base) MCG/ACT inhaler           TAKE these medications     albuterol 108 (90 Base) MCG/ACT inhaler Commonly known as: VENTOLIN HFA Inhale 2 puffs into the lungs every 4 (four) hours as needed for wheezing or shortness of breath. Replaces: albuterol 4 MG tablet    benzonatate 200 MG capsule Commonly known as: TESSALON Take 1 capsule (200 mg total) by mouth 2 (two) times daily as needed for cough. What changed:  how much to take when to take this reasons to take this  busPIRone 5 MG tablet Commonly known as: BUSPAR Take 1 tablet (5 mg total) by mouth 2 (two) times daily. What changed:  how much to take when to take this reasons to take this    mometasone-formoterol 200-5 MCG/ACT Aero Commonly known as: DULERA Inhale 2 puffs into the lungs 2 (two) times daily.    predniSONE 50 MG tablet Commonly known as: DELTASONE Take 1 tablet (50 mg total) by mouth daily with breakfast. Start taking on: August 29, 2021       Relevant Imaging Results:  Relevant Lab Results:   Additional Information    Elliot Gault, LCSW

## 2021-08-28 NOTE — Discharge Instructions (Signed)
1)Take Dulera (Mometasone-formoterol) Aero - 2 puffs Twice a day 2)Albuterol/Ventolin inhaler - as needed for cough/shortness of breath, or Wheezing 3)Take Prednisone 50 mg daily for days 4)Repeat CBC and CMP, CPK Blood Test on Monday 09/02/2021

## 2021-08-28 NOTE — Care Management Obs Status (Signed)
MEDICARE OBSERVATION STATUS NOTIFICATION   Patient Details  Name: Peter Levy MRN: [id number] Date of Birth: [birth year]   Medicare Observation Status Notification Given:  Yes    Corey Harold 08/28/2021, 9:31 AM

## 2021-09-10 DIAGNOSIS — E663 Overweight: Secondary | ICD-10-CM | POA: Diagnosis not present

## 2021-09-10 DIAGNOSIS — F73 Profound intellectual disabilities: Secondary | ICD-10-CM | POA: Diagnosis not present

## 2021-09-10 DIAGNOSIS — R7309 Other abnormal glucose: Secondary | ICD-10-CM | POA: Diagnosis not present

## 2021-09-10 DIAGNOSIS — Z1331 Encounter for screening for depression: Secondary | ICD-10-CM | POA: Diagnosis not present

## 2021-09-10 DIAGNOSIS — E7849 Other hyperlipidemia: Secondary | ICD-10-CM | POA: Diagnosis not present

## 2021-09-10 DIAGNOSIS — G809 Cerebral palsy, unspecified: Secondary | ICD-10-CM | POA: Diagnosis not present

## 2021-09-10 DIAGNOSIS — Z6831 Body mass index (BMI) 31.0-31.9, adult: Secondary | ICD-10-CM | POA: Diagnosis not present

## 2021-09-10 DIAGNOSIS — E782 Mixed hyperlipidemia: Secondary | ICD-10-CM | POA: Diagnosis not present

## 2021-09-10 DIAGNOSIS — J449 Chronic obstructive pulmonary disease, unspecified: Secondary | ICD-10-CM | POA: Diagnosis not present

## 2021-09-10 DIAGNOSIS — Z0000 Encounter for general adult medical examination without abnormal findings: Secondary | ICD-10-CM | POA: Diagnosis not present

## 2021-09-10 DIAGNOSIS — M6282 Rhabdomyolysis: Secondary | ICD-10-CM | POA: Diagnosis not present

## 2021-10-22 DIAGNOSIS — L110 Acquired keratosis follicularis: Secondary | ICD-10-CM | POA: Diagnosis not present

## 2021-10-22 DIAGNOSIS — M79671 Pain in right foot: Secondary | ICD-10-CM | POA: Diagnosis not present

## 2021-10-22 DIAGNOSIS — I739 Peripheral vascular disease, unspecified: Secondary | ICD-10-CM | POA: Diagnosis not present

## 2021-10-22 DIAGNOSIS — M79672 Pain in left foot: Secondary | ICD-10-CM | POA: Diagnosis not present

## 2022-01-21 DIAGNOSIS — I739 Peripheral vascular disease, unspecified: Secondary | ICD-10-CM | POA: Diagnosis not present

## 2022-01-21 DIAGNOSIS — M79671 Pain in right foot: Secondary | ICD-10-CM | POA: Diagnosis not present

## 2022-01-21 DIAGNOSIS — M79672 Pain in left foot: Secondary | ICD-10-CM | POA: Diagnosis not present

## 2022-01-21 DIAGNOSIS — L110 Acquired keratosis follicularis: Secondary | ICD-10-CM | POA: Diagnosis not present

## 2022-02-12 DIAGNOSIS — Z6832 Body mass index (BMI) 32.0-32.9, adult: Secondary | ICD-10-CM | POA: Diagnosis not present

## 2022-02-12 DIAGNOSIS — E6609 Other obesity due to excess calories: Secondary | ICD-10-CM | POA: Diagnosis not present

## 2022-02-12 DIAGNOSIS — J449 Chronic obstructive pulmonary disease, unspecified: Secondary | ICD-10-CM | POA: Diagnosis not present

## 2022-04-01 DIAGNOSIS — M79671 Pain in right foot: Secondary | ICD-10-CM | POA: Diagnosis not present

## 2022-04-01 DIAGNOSIS — M79672 Pain in left foot: Secondary | ICD-10-CM | POA: Diagnosis not present

## 2022-04-01 DIAGNOSIS — I739 Peripheral vascular disease, unspecified: Secondary | ICD-10-CM | POA: Diagnosis not present

## 2022-04-01 DIAGNOSIS — L110 Acquired keratosis follicularis: Secondary | ICD-10-CM | POA: Diagnosis not present

## 2022-07-01 DIAGNOSIS — I739 Peripheral vascular disease, unspecified: Secondary | ICD-10-CM | POA: Diagnosis not present

## 2022-07-01 DIAGNOSIS — M79672 Pain in left foot: Secondary | ICD-10-CM | POA: Diagnosis not present

## 2022-07-01 DIAGNOSIS — M79671 Pain in right foot: Secondary | ICD-10-CM | POA: Diagnosis not present

## 2022-07-01 DIAGNOSIS — L110 Acquired keratosis follicularis: Secondary | ICD-10-CM | POA: Diagnosis not present

## 2022-09-11 DIAGNOSIS — Z125 Encounter for screening for malignant neoplasm of prostate: Secondary | ICD-10-CM | POA: Diagnosis not present

## 2022-09-11 DIAGNOSIS — Z6830 Body mass index (BMI) 30.0-30.9, adult: Secondary | ICD-10-CM | POA: Diagnosis not present

## 2022-09-11 DIAGNOSIS — E782 Mixed hyperlipidemia: Secondary | ICD-10-CM | POA: Diagnosis not present

## 2022-09-11 DIAGNOSIS — E6609 Other obesity due to excess calories: Secondary | ICD-10-CM | POA: Diagnosis not present

## 2022-09-11 DIAGNOSIS — J449 Chronic obstructive pulmonary disease, unspecified: Secondary | ICD-10-CM | POA: Diagnosis not present

## 2022-09-11 DIAGNOSIS — Z1331 Encounter for screening for depression: Secondary | ICD-10-CM | POA: Diagnosis not present

## 2022-09-11 DIAGNOSIS — Z0000 Encounter for general adult medical examination without abnormal findings: Secondary | ICD-10-CM | POA: Diagnosis not present

## 2022-09-11 DIAGNOSIS — F73 Profound intellectual disabilities: Secondary | ICD-10-CM | POA: Diagnosis not present

## 2022-09-11 DIAGNOSIS — E559 Vitamin D deficiency, unspecified: Secondary | ICD-10-CM | POA: Diagnosis not present

## 2022-09-11 DIAGNOSIS — M6282 Rhabdomyolysis: Secondary | ICD-10-CM | POA: Diagnosis not present

## 2022-09-11 DIAGNOSIS — E7849 Other hyperlipidemia: Secondary | ICD-10-CM | POA: Diagnosis not present

## 2022-09-11 DIAGNOSIS — Z23 Encounter for immunization: Secondary | ICD-10-CM | POA: Diagnosis not present

## 2022-09-11 DIAGNOSIS — R7309 Other abnormal glucose: Secondary | ICD-10-CM | POA: Diagnosis not present

## 2022-09-11 DIAGNOSIS — G809 Cerebral palsy, unspecified: Secondary | ICD-10-CM | POA: Diagnosis not present

## 2022-09-18 DIAGNOSIS — I739 Peripheral vascular disease, unspecified: Secondary | ICD-10-CM | POA: Diagnosis not present

## 2022-09-18 DIAGNOSIS — L110 Acquired keratosis follicularis: Secondary | ICD-10-CM | POA: Diagnosis not present

## 2022-09-18 DIAGNOSIS — M79672 Pain in left foot: Secondary | ICD-10-CM | POA: Diagnosis not present

## 2022-09-18 DIAGNOSIS — M79671 Pain in right foot: Secondary | ICD-10-CM | POA: Diagnosis not present

## 2022-10-10 ENCOUNTER — Emergency Department (HOSPITAL_COMMUNITY)
Admission: EM | Admit: 2022-10-10 | Discharge: 2022-10-10 | Disposition: A | Discharge status: Home / Self Care | Payer: Medicare Other | Attending: Emergency Medicine | Admitting: Emergency Medicine

## 2022-10-10 ENCOUNTER — Other Ambulatory Visit: Payer: Self-pay

## 2022-10-10 ENCOUNTER — Emergency Department (HOSPITAL_COMMUNITY): Payer: Medicare Other

## 2022-10-10 DIAGNOSIS — M62830 Muscle spasm of back: Secondary | ICD-10-CM | POA: Diagnosis not present

## 2022-10-10 DIAGNOSIS — R109 Unspecified abdominal pain: Secondary | ICD-10-CM | POA: Insufficient documentation

## 2022-10-10 DIAGNOSIS — G809 Cerebral palsy, unspecified: Secondary | ICD-10-CM | POA: Diagnosis not present

## 2022-10-10 DIAGNOSIS — K6389 Other specified diseases of intestine: Secondary | ICD-10-CM | POA: Diagnosis not present

## 2022-10-10 DIAGNOSIS — K4090 Unilateral inguinal hernia, without obstruction or gangrene, not specified as recurrent: Secondary | ICD-10-CM | POA: Diagnosis not present

## 2022-10-10 LAB — COMPREHENSIVE METABOLIC PANEL
ALT: 26 U/L (ref 0–44)
AST: 25 U/L (ref 15–41)
Albumin: 4.1 g/dL (ref 3.5–5.0)
Alkaline Phosphatase: 43 U/L (ref 38–126)
Anion gap: 9 (ref 5–15)
BUN: 17 mg/dL (ref 8–23)
CO2: 25 mmol/L (ref 22–32)
Calcium: 9.4 mg/dL (ref 8.9–10.3)
Chloride: 109 mmol/L (ref 98–111)
Creatinine, Ser: 1.12 mg/dL (ref 0.61–1.24)
GFR, Estimated: >60 mL/min (ref >60)
Glucose, Bld: 96 mg/dL (ref 70–99)
Potassium: 4 mmol/L (ref 3.5–5.1)
Sodium: 143 mmol/L (ref 135–145)
Total Bilirubin: 0.5 mg/dL (ref 0.3–1.2)
Total Protein: 7.2 g/dL (ref 6.5–8.1)

## 2022-10-10 LAB — CBC
HCT: 44.4 % (ref 39.0–52.0)
Hemoglobin: 14.7 g/dL (ref 13.0–17.0)
MCH: 30.4 pg (ref 26.0–34.0)
MCHC: 33.1 g/dL (ref 30.0–36.0)
MCV: 91.9 fL (ref 80.0–100.0)
Platelets: 247 10*3/uL (ref 150–400)
RBC: 4.83 MIL/uL (ref 4.22–5.81)
RDW: 12.6 % (ref 11.5–15.5)
WBC: 3.8 10*3/uL — ABNORMAL LOW (ref 4.0–10.5)
nRBC: 0 % (ref 0.0–0.2)

## 2022-10-10 LAB — CK: Total CK: 201 U/L (ref 49–397)

## 2022-10-10 LAB — LIPASE, BLOOD: Lipase: 29 U/L (ref 11–51)

## 2022-10-10 MED ORDER — TIZANIDINE HCL 4 MG PO TABS: 4.0000 mg | ORAL_TABLET | Freq: Four times a day (QID) | ORAL | 0 refills | Status: DC | PRN

## 2022-10-10 MED ORDER — IBUPROFEN 800 MG PO TABS: 800.0000 mg | ORAL_TABLET | Freq: Three times a day (TID) | ORAL | 0 refills | Status: DC

## 2022-10-10 NOTE — ED Triage Notes (Addendum)
Per EMS, Pt, from Group Home, c/o intermittent spasms in L flank.  Hx of cerebral palsy.  Pt denies n/v/d and dysuria.      Group Home owner reports Pt had a similar episode last year and was diagnosed w/ Rhabdomyolysis.

## 2022-10-10 NOTE — ED Provider Notes (Signed)
[facility] EMERGENCY DEPARTMENT Provider Note   CSN: [id number] Arrival date & time: 10/10/22  1200     History Chief Complaint  Patient presents with   Flank Pain    Peter Levy is a 66 y.o. male.   Flank Pain Pertinent negatives include no chest pain and no shortness of breath.  Patient presents with 6 hours of left flank pain. He describes the pain as spasms in his back that come and go every 20 minutes or so. Denies any dysuria, hematuria, changes in urinary frequency or urgency, or changes in alertness or orientation to time. Denies any recent changes in medications or use of over the counter medications such as ibuprofen, naproxen, or tylenol.     Home Medications Prior to Admission medications   Medication Sig Start Date End Date Taking? Authorizing Provider  albuterol (VENTOLIN HFA) 108 (90 Base) MCG/ACT inhaler Inhale 2 puffs into the lungs every 4 (four) hours as needed for wheezing or shortness of breath. 08/28/21   Shon Hale, MD  benzonatate (TESSALON) 200 MG capsule Take 1 capsule (200 mg total) by mouth 2 (two) times daily as needed for cough. 08/28/21   Shon Hale, MD  busPIRone (BUSPAR) 5 MG tablet Take 1 tablet (5 mg total) by mouth 2 (two) times daily. 08/28/21   Shon Hale, MD  mometasone-formoterol (DULERA) 200-5 MCG/ACT AERO Inhale 2 puffs into the lungs 2 (two) times daily. 08/28/21   Shon Hale, MD  predniSONE (DELTASONE) 50 MG tablet Take 1 tablet (50 mg total) by mouth daily with breakfast. 08/29/21   Shon Hale, MD      Allergies    Patient has no known allergies.    Review of Systems   Review of Systems  Constitutional:  Negative for diaphoresis, fatigue and fever.  Respiratory:  Negative for chest tightness and shortness of breath.   Cardiovascular:  Negative for chest pain.  Gastrointestinal:  Negative for constipation, diarrhea, nausea and vomiting.  Endocrine: Negative for polyuria.  Genitourinary:   Positive for flank pain. Negative for decreased urine volume, difficulty urinating, dysuria, hematuria and urgency.  Musculoskeletal:  Positive for back pain.  Psychiatric/Behavioral:  Negative for behavioral problems, confusion and decreased concentration.   All other systems reviewed and are negative.   Physical Exam Updated Vital Signs BP 130/87 (BP Location: Right Arm)   Pulse (!) 107   Temp 98.4 F (36.9 C) (Oral)   Resp 20   Ht 5\' 5"  (1.651 m)   Wt 83 kg   SpO2 95%   BMI 30.45 kg/m  Physical Exam Vitals and nursing note reviewed.  Constitutional:      General: He is not in acute distress.    Appearance: Normal appearance. He is normal weight. He is not ill-appearing.  HENT:     Head: Normocephalic and atraumatic.     Nose: Nose normal.     Mouth/Throat:     Mouth: Mucous membranes are moist.     Pharynx: Oropharynx is clear. No oropharyngeal exudate or posterior oropharyngeal erythema.  Eyes:     General: No scleral icterus.    Conjunctiva/sclera: Conjunctivae normal.     Pupils: Pupils are equal, round, and reactive to light.  Cardiovascular:     Rate and Rhythm: Normal rate and regular rhythm.     Heart sounds: Normal heart sounds.  Pulmonary:     Effort: Pulmonary effort is normal. No respiratory distress.     Breath sounds: Normal breath sounds. No wheezing, rhonchi or  rales.  Chest:     Chest wall: No tenderness.  Abdominal:     General: Abdomen is flat. Bowel sounds are normal.     Palpations: Abdomen is soft. There is no mass.     Tenderness: There is no right CVA tenderness, left CVA tenderness, guarding or rebound.  Musculoskeletal:        General: No swelling or tenderness.     Right lower leg: No edema.     Left lower leg: No edema.  Skin:    General: Skin is warm and dry.     Capillary Refill: Capillary refill takes less than 2 seconds.     Findings: No bruising, erythema or rash.  Neurological:     General: No focal deficit present.     Mental  Status: He is alert. Mental status is at baseline.  Psychiatric:        Mood and Affect: Mood normal.        Behavior: Behavior normal.        Thought Content: Thought content normal.        Judgment: Judgment normal.    ED Results / Procedures / Treatments   Labs (all labs ordered are listed, but only abnormal results are displayed) Labs Reviewed  CBC - Abnormal; Notable for the following components:      Result Value   WBC 3.8 (*)    All other components within normal limits  LIPASE, BLOOD  COMPREHENSIVE METABOLIC PANEL  CK  URINALYSIS, ROUTINE W REFLEX MICROSCOPIC    EKG None  Radiology No results found.  Procedures Procedures   Medications Ordered in ED Medications - No data to display  ED Course/ Medical Decision Making/ A&P Clinical Course as of 10/10/22 1533  Fri Oct 10, 2022  1533 Glucose: 96 [OZ]  1533 CK Total: 201 [OZ]    Clinical Course User Index [OZ] Luvenia Heller, PA-C                           Medical Decision Making Amount and/or Complexity of Data Reviewed Independent Historian: caregiver    Details: Caregiver from group home Labs: ordered. Decision-making details documented in ED Course. Radiology: ordered.  Risk Prescription drug management.   This patient presents to the ED for concern of left flank pain. Differential diagnosis includes nephrolithiasis, pyelonephritis, UTI, muscle strain, muscle spasms, and shingles.    Additional history obtained:  Additional history obtained from patient and group home caregiver.   Lab Tests:  I Ordered, and personally interpreted labs.  The pertinent results include:  CK negative. All other labs predominately normal.   Imaging Studies ordered:  I ordered imaging studies including CT renal stone study  I independently visualized and interpreted imaging which showed no evidence of renal involvement or obstructing stone in the ureters. I agree with the radiologist  interpretation   Medicines ordered and prescription drug management:  I have reviewed the patients home medicines and have made adjustments as needed   Problem List / ED Course:  Patient arrived with complaints of 6 hours of right flank pain. Patient states that he has had multiple episodes of flank pain with a previous diagnosis of rhabdomyolysis one year prior. CK was ordered and normal making rhabdomyolysis unlikely. Patient was unable to provide a urine sample to rule out potential infectious etiology which he believed was unlikely due to lack of urinary symptoms or changes in urinary habits. Advised patient that this  was likely an isolated muscle spasm and discharged to home with tizanidine and ibuprofen. Patient verbalized understanding return precautions if condition were to worsen after discharge.   Social Determinants of Health:  Patient is a resident of a group home.  Final Clinical Impression(s) / ED Diagnoses Final diagnoses:  None    Rx / DC Orders ED Discharge Orders     None         Vladimir Creeks 10/10/22 2117    Noemi Chapel, MD 10/11/22 1053

## 2022-10-10 NOTE — Discharge Instructions (Addendum)
Take Tizanidine every 6 hours as needed for muscle spasms. Take ibuprofen every 8 hours as needed for muscles spasms.

## 2022-10-10 NOTE — ED Notes (Signed)
Pt attempted to give urine sample at this time, but unable to. Pt given fluids to drink per PA at bedside.

## 2022-10-10 NOTE — ED Notes (Signed)
Pt and caregiver at bedside refused in and out cath. Harriet Pho, PA notified.

## 2022-11-27 DIAGNOSIS — M79672 Pain in left foot: Secondary | ICD-10-CM | POA: Diagnosis not present

## 2022-11-27 DIAGNOSIS — L110 Acquired keratosis follicularis: Secondary | ICD-10-CM | POA: Diagnosis not present

## 2022-11-27 DIAGNOSIS — M79671 Pain in right foot: Secondary | ICD-10-CM | POA: Diagnosis not present

## 2022-11-27 DIAGNOSIS — I739 Peripheral vascular disease, unspecified: Secondary | ICD-10-CM | POA: Diagnosis not present

## 2023-02-05 DIAGNOSIS — L110 Acquired keratosis follicularis: Secondary | ICD-10-CM | POA: Diagnosis not present

## 2023-02-05 DIAGNOSIS — M79672 Pain in left foot: Secondary | ICD-10-CM | POA: Diagnosis not present

## 2023-02-05 DIAGNOSIS — I739 Peripheral vascular disease, unspecified: Secondary | ICD-10-CM | POA: Diagnosis not present

## 2023-02-05 DIAGNOSIS — M79671 Pain in right foot: Secondary | ICD-10-CM | POA: Diagnosis not present

## 2023-03-17 DIAGNOSIS — E559 Vitamin D deficiency, unspecified: Secondary | ICD-10-CM | POA: Diagnosis not present

## 2023-03-17 DIAGNOSIS — F73 Profound intellectual disabilities: Secondary | ICD-10-CM | POA: Diagnosis not present

## 2023-03-17 DIAGNOSIS — Z6826 Body mass index (BMI) 26.0-26.9, adult: Secondary | ICD-10-CM | POA: Diagnosis not present

## 2023-03-17 DIAGNOSIS — G809 Cerebral palsy, unspecified: Secondary | ICD-10-CM | POA: Diagnosis not present

## 2023-03-17 DIAGNOSIS — E782 Mixed hyperlipidemia: Secondary | ICD-10-CM | POA: Diagnosis not present

## 2023-03-17 DIAGNOSIS — E663 Overweight: Secondary | ICD-10-CM | POA: Diagnosis not present

## 2023-03-17 DIAGNOSIS — J449 Chronic obstructive pulmonary disease, unspecified: Secondary | ICD-10-CM | POA: Diagnosis not present

## 2023-05-14 DIAGNOSIS — Z0000 Encounter for general adult medical examination without abnormal findings: Secondary | ICD-10-CM | POA: Diagnosis not present

## 2023-05-14 DIAGNOSIS — G803 Athetoid cerebral palsy: Secondary | ICD-10-CM | POA: Diagnosis not present

## 2023-05-14 DIAGNOSIS — G8929 Other chronic pain: Secondary | ICD-10-CM | POA: Diagnosis not present

## 2023-05-14 DIAGNOSIS — R5381 Other malaise: Secondary | ICD-10-CM | POA: Diagnosis not present

## 2023-05-14 DIAGNOSIS — R531 Weakness: Secondary | ICD-10-CM | POA: Diagnosis not present

## 2023-05-14 DIAGNOSIS — R109 Unspecified abdominal pain: Secondary | ICD-10-CM | POA: Diagnosis not present

## 2023-08-18 DIAGNOSIS — I739 Peripheral vascular disease, unspecified: Secondary | ICD-10-CM | POA: Diagnosis not present

## 2023-08-18 DIAGNOSIS — M79672 Pain in left foot: Secondary | ICD-10-CM | POA: Diagnosis not present

## 2023-08-18 DIAGNOSIS — M79671 Pain in right foot: Secondary | ICD-10-CM | POA: Diagnosis not present

## 2023-08-18 DIAGNOSIS — L110 Acquired keratosis follicularis: Secondary | ICD-10-CM | POA: Diagnosis not present

## 2023-09-22 DIAGNOSIS — E039 Hypothyroidism, unspecified: Secondary | ICD-10-CM | POA: Diagnosis not present

## 2023-09-22 DIAGNOSIS — E782 Mixed hyperlipidemia: Secondary | ICD-10-CM | POA: Diagnosis not present

## 2023-09-22 DIAGNOSIS — G809 Cerebral palsy, unspecified: Secondary | ICD-10-CM | POA: Diagnosis not present

## 2023-09-22 DIAGNOSIS — Z6825 Body mass index (BMI) 25.0-25.9, adult: Secondary | ICD-10-CM | POA: Diagnosis not present

## 2023-09-22 DIAGNOSIS — R7309 Other abnormal glucose: Secondary | ICD-10-CM | POA: Diagnosis not present

## 2023-09-22 DIAGNOSIS — Z125 Encounter for screening for malignant neoplasm of prostate: Secondary | ICD-10-CM | POA: Diagnosis not present

## 2023-09-22 DIAGNOSIS — E559 Vitamin D deficiency, unspecified: Secondary | ICD-10-CM | POA: Diagnosis not present

## 2023-09-22 DIAGNOSIS — Z1331 Encounter for screening for depression: Secondary | ICD-10-CM | POA: Diagnosis not present

## 2023-09-22 DIAGNOSIS — Z0000 Encounter for general adult medical examination without abnormal findings: Secondary | ICD-10-CM | POA: Diagnosis not present

## 2023-09-22 DIAGNOSIS — E663 Overweight: Secondary | ICD-10-CM | POA: Diagnosis not present

## 2023-09-22 DIAGNOSIS — J449 Chronic obstructive pulmonary disease, unspecified: Secondary | ICD-10-CM | POA: Diagnosis not present

## 2023-09-22 DIAGNOSIS — Z23 Encounter for immunization: Secondary | ICD-10-CM | POA: Diagnosis not present

## 2023-12-01 DIAGNOSIS — M79671 Pain in right foot: Secondary | ICD-10-CM | POA: Diagnosis not present

## 2023-12-01 DIAGNOSIS — I739 Peripheral vascular disease, unspecified: Secondary | ICD-10-CM | POA: Diagnosis not present

## 2023-12-01 DIAGNOSIS — L110 Acquired keratosis follicularis: Secondary | ICD-10-CM | POA: Diagnosis not present

## 2023-12-01 DIAGNOSIS — M79672 Pain in left foot: Secondary | ICD-10-CM | POA: Diagnosis not present

## 2023-12-30 DIAGNOSIS — R918 Other nonspecific abnormal finding of lung field: Secondary | ICD-10-CM | POA: Diagnosis not present

## 2023-12-30 DIAGNOSIS — I959 Hypotension, unspecified: Secondary | ICD-10-CM | POA: Diagnosis not present

## 2023-12-30 DIAGNOSIS — R9389 Abnormal findings on diagnostic imaging of other specified body structures: Secondary | ICD-10-CM | POA: Diagnosis not present

## 2023-12-30 DIAGNOSIS — G809 Cerebral palsy, unspecified: Secondary | ICD-10-CM | POA: Diagnosis not present

## 2023-12-30 DIAGNOSIS — M62838 Other muscle spasm: Secondary | ICD-10-CM | POA: Diagnosis not present

## 2023-12-30 DIAGNOSIS — R079 Chest pain, unspecified: Secondary | ICD-10-CM | POA: Diagnosis not present

## 2024-01-14 DIAGNOSIS — Z6824 Body mass index (BMI) 24.0-24.9, adult: Secondary | ICD-10-CM | POA: Diagnosis not present

## 2024-01-14 DIAGNOSIS — R0789 Other chest pain: Secondary | ICD-10-CM | POA: Diagnosis not present

## 2024-01-14 DIAGNOSIS — R140 Abdominal distension (gaseous): Secondary | ICD-10-CM | POA: Diagnosis not present

## 2024-01-27 ENCOUNTER — Encounter: Payer: Self-pay | Admitting: *Deleted

## 2024-02-05 ENCOUNTER — Ambulatory Visit: Admitting: Gastroenterology

## 2024-02-05 ENCOUNTER — Encounter: Payer: Self-pay | Admitting: Gastroenterology

## 2024-02-15 DIAGNOSIS — J069 Acute upper respiratory infection, unspecified: Secondary | ICD-10-CM | POA: Diagnosis not present

## 2024-02-15 DIAGNOSIS — Z6824 Body mass index (BMI) 24.0-24.9, adult: Secondary | ICD-10-CM | POA: Diagnosis not present

## 2024-02-15 DIAGNOSIS — J449 Chronic obstructive pulmonary disease, unspecified: Secondary | ICD-10-CM | POA: Diagnosis not present

## 2024-02-15 DIAGNOSIS — G809 Cerebral palsy, unspecified: Secondary | ICD-10-CM | POA: Diagnosis not present

## 2024-02-22 ENCOUNTER — Other Ambulatory Visit: Payer: Self-pay

## 2024-02-22 ENCOUNTER — Emergency Department (HOSPITAL_COMMUNITY)

## 2024-02-22 ENCOUNTER — Encounter (HOSPITAL_COMMUNITY): Payer: Self-pay

## 2024-02-22 ENCOUNTER — Inpatient Hospital Stay (HOSPITAL_COMMUNITY)
Admission: EM | Admit: 2024-02-22 | Discharge: 2024-02-26 | DRG: 194 | Disposition: A | Discharge status: Home Health | Source: Ambulatory Visit | Attending: Family Medicine | Admitting: Family Medicine

## 2024-02-22 DIAGNOSIS — J069 Acute upper respiratory infection, unspecified: Secondary | ICD-10-CM | POA: Diagnosis not present

## 2024-02-22 DIAGNOSIS — G809 Cerebral palsy, unspecified: Secondary | ICD-10-CM | POA: Diagnosis not present

## 2024-02-22 DIAGNOSIS — Z23 Encounter for immunization: Secondary | ICD-10-CM | POA: Diagnosis not present

## 2024-02-22 DIAGNOSIS — J918 Pleural effusion in other conditions classified elsewhere: Secondary | ICD-10-CM | POA: Diagnosis present

## 2024-02-22 DIAGNOSIS — R000 Tachycardia, unspecified: Secondary | ICD-10-CM | POA: Diagnosis present

## 2024-02-22 DIAGNOSIS — Z1152 Encounter for screening for COVID-19: Secondary | ICD-10-CM | POA: Diagnosis not present

## 2024-02-22 DIAGNOSIS — J9811 Atelectasis: Secondary | ICD-10-CM | POA: Diagnosis present

## 2024-02-22 DIAGNOSIS — R1310 Dysphagia, unspecified: Secondary | ICD-10-CM | POA: Diagnosis present

## 2024-02-22 DIAGNOSIS — J168 Pneumonia due to other specified infectious organisms: Secondary | ICD-10-CM | POA: Diagnosis not present

## 2024-02-22 DIAGNOSIS — Z79899 Other long term (current) drug therapy: Secondary | ICD-10-CM | POA: Diagnosis not present

## 2024-02-22 DIAGNOSIS — R0609 Other forms of dyspnea: Secondary | ICD-10-CM | POA: Diagnosis present

## 2024-02-22 DIAGNOSIS — J690 Pneumonitis due to inhalation of food and vomit: Secondary | ICD-10-CM | POA: Diagnosis present

## 2024-02-22 DIAGNOSIS — Z6824 Body mass index (BMI) 24.0-24.9, adult: Secondary | ICD-10-CM | POA: Diagnosis not present

## 2024-02-22 DIAGNOSIS — Z7951 Long term (current) use of inhaled steroids: Secondary | ICD-10-CM

## 2024-02-22 DIAGNOSIS — D649 Anemia, unspecified: Secondary | ICD-10-CM | POA: Diagnosis not present

## 2024-02-22 DIAGNOSIS — J189 Pneumonia, unspecified organism: Secondary | ICD-10-CM | POA: Diagnosis not present

## 2024-02-22 DIAGNOSIS — R0602 Shortness of breath: Secondary | ICD-10-CM | POA: Diagnosis not present

## 2024-02-22 LAB — BASIC METABOLIC PANEL WITH GFR
Anion gap: 11 (ref 5–15)
BUN: 16 mg/dL (ref 8–23)
CO2: 23 mmol/L (ref 22–32)
Calcium: 8.8 mg/dL — ABNORMAL LOW (ref 8.9–10.3)
Chloride: 102 mmol/L (ref 98–111)
Creatinine, Ser: 1.02 mg/dL (ref 0.61–1.24)
GFR, Estimated: >60 mL/min
Glucose, Bld: 102 mg/dL — ABNORMAL HIGH (ref 70–99)
Potassium: 3.9 mmol/L (ref 3.5–5.1)
Sodium: 136 mmol/L (ref 135–145)

## 2024-02-22 LAB — CBC WITH DIFFERENTIAL/PLATELET
Abs Immature Granulocytes: 0.04 10*3/uL (ref 0.00–0.07)
Basophils Absolute: 0 10*3/uL (ref 0.0–0.1)
Basophils Relative: 0 %
Eosinophils Absolute: 0.2 10*3/uL (ref 0.0–0.5)
Eosinophils Relative: 3 %
HCT: 38.1 % — ABNORMAL LOW (ref 39.0–52.0)
Hemoglobin: 12.3 g/dL — ABNORMAL LOW (ref 13.0–17.0)
Immature Granulocytes: 1 %
Lymphocytes Relative: 17 %
Lymphs Abs: 1.4 10*3/uL (ref 0.7–4.0)
MCH: 29.9 pg (ref 26.0–34.0)
MCHC: 32.3 g/dL (ref 30.0–36.0)
MCV: 92.5 fL (ref 80.0–100.0)
Monocytes Absolute: 1 10*3/uL (ref 0.1–1.0)
Monocytes Relative: 13 %
Neutro Abs: 5.5 10*3/uL (ref 1.7–7.7)
Neutrophils Relative %: 66 %
Platelets: 397 10*3/uL (ref 150–400)
RBC: 4.12 MIL/uL — ABNORMAL LOW (ref 4.22–5.81)
RDW: 12 % (ref 11.5–15.5)
WBC: 8.2 10*3/uL (ref 4.0–10.5)
nRBC: 0 % (ref 0.0–0.2)

## 2024-02-22 LAB — HEPATIC FUNCTION PANEL
ALT: 43 U/L (ref 0–44)
AST: 35 U/L (ref 15–41)
Albumin: 3 g/dL — ABNORMAL LOW (ref 3.5–5.0)
Alkaline Phosphatase: 39 U/L (ref 38–126)
Bilirubin, Direct: 0.2 mg/dL (ref 0.0–0.2)
Indirect Bilirubin: 0.7 mg/dL (ref 0.3–0.9)
Total Bilirubin: 0.9 mg/dL (ref 0.0–1.2)
Total Protein: 7.4 g/dL (ref 6.5–8.1)

## 2024-02-22 LAB — BRAIN NATRIURETIC PEPTIDE: B Natriuretic Peptide: 31 pg/mL (ref 0.0–100.0)

## 2024-02-22 LAB — RESP PANEL BY RT-PCR (RSV, FLU A&B, COVID)  RVPGX2
Influenza A by PCR: NEGATIVE
Influenza B by PCR: NEGATIVE
Resp Syncytial Virus by PCR: NEGATIVE
SARS Coronavirus 2 by RT PCR: NEGATIVE

## 2024-02-22 LAB — D-DIMER, QUANTITATIVE: D-Dimer, Quant: 1.86 ug{FEU}/mL — ABNORMAL HIGH (ref 0.00–0.50)

## 2024-02-22 LAB — LACTIC ACID, PLASMA: Lactic Acid, Venous: 1.3 mmol/L (ref 0.5–1.9)

## 2024-02-22 MED ORDER — ENOXAPARIN SODIUM 40 MG/0.4ML IJ SOSY
40.0000 mg | PREFILLED_SYRINGE | INTRAMUSCULAR | Status: DC
Start: 2024-02-22 — End: 2024-02-26
  Administered 2024-02-22 – 2024-02-25 (×4): 40 mg via SUBCUTANEOUS
  Filled 2024-02-22 (×4): qty 0.4

## 2024-02-22 MED ORDER — BISACODYL 5 MG PO TBEC: 5.0000 mg | DELAYED_RELEASE_TABLET | Freq: Every day | ORAL | Status: DC | PRN

## 2024-02-22 MED ORDER — PNEUMOCOCCAL 20-VAL CONJ VACC 0.5 ML IM SUSY
0.5000 mL | PREFILLED_SYRINGE | INTRAMUSCULAR | Status: AC
  Administered 2024-02-23: 0.5 mL via INTRAMUSCULAR
  Filled 2024-02-22: qty 0.5

## 2024-02-22 MED ORDER — CODEINE SULFATE 15 MG PO TABS: 15.0000 mg | ORAL_TABLET | Freq: Once | ORAL | Status: DC

## 2024-02-22 MED ORDER — ACETAMINOPHEN 650 MG RE SUPP: 650.0000 mg | Freq: Four times a day (QID) | RECTAL | Status: DC | PRN

## 2024-02-22 MED ORDER — AZITHROMYCIN 250 MG PO TABS
500.0000 mg | ORAL_TABLET | Freq: Once | ORAL | Status: AC
  Administered 2024-02-22: 500 mg via ORAL
  Filled 2024-02-22: qty 2

## 2024-02-22 MED ORDER — DM-GUAIFENESIN ER 30-600 MG PO TB12
1.0000 | ORAL_TABLET | Freq: Two times a day (BID) | ORAL | Status: AC
  Administered 2024-02-22 – 2024-02-24 (×5): 1 via ORAL
  Filled 2024-02-22 (×5): qty 1

## 2024-02-22 MED ORDER — AZITHROMYCIN 500 MG IV SOLR
250.0000 mg | INTRAVENOUS | Status: DC
  Filled 2024-02-22 (×2): qty 2.5

## 2024-02-22 MED ORDER — IOHEXOL 300 MG/ML  SOLN
75.0000 mL | Freq: Once | INTRAMUSCULAR | Status: AC | PRN
  Administered 2024-02-22: 75 mL via INTRAVENOUS

## 2024-02-22 MED ORDER — SODIUM CHLORIDE 0.9 % IV SOLN
1.0000 g | Freq: Once | INTRAVENOUS | Status: AC
  Administered 2024-02-22: 1 g via INTRAVENOUS
  Filled 2024-02-22: qty 10

## 2024-02-22 MED ORDER — SODIUM CHLORIDE 0.9 % IV BOLUS
1000.0000 mL | Freq: Once | INTRAVENOUS | Status: AC
  Administered 2024-02-22: 1000 mL via INTRAVENOUS

## 2024-02-22 MED ORDER — ALBUTEROL SULFATE HFA 108 (90 BASE) MCG/ACT IN AERS
2.0000 | INHALATION_SPRAY | RESPIRATORY_TRACT | Status: DC | PRN
  Administered 2024-02-22: 2 via RESPIRATORY_TRACT
  Filled 2024-02-22: qty 6.7

## 2024-02-22 MED ORDER — SODIUM CHLORIDE 0.9 % IV SOLN
1.0000 g | INTRAVENOUS | Status: DC
  Administered 2024-02-23 – 2024-02-24 (×2): 1 g via INTRAVENOUS
  Filled 2024-02-22 (×2): qty 10

## 2024-02-22 MED ORDER — POLYETHYLENE GLYCOL 3350 17 G PO PACK
17.0000 g | PACK | Freq: Every day | ORAL | Status: DC
  Administered 2024-02-23 – 2024-02-26 (×3): 17 g via ORAL
  Filled 2024-02-22 (×3): qty 1

## 2024-02-22 MED ORDER — LIDOCAINE 5 % EX PTCH
1.0000 | MEDICATED_PATCH | CUTANEOUS | Status: DC
  Administered 2024-02-23 – 2024-02-25 (×4): 1 via TRANSDERMAL
  Filled 2024-02-22 (×4): qty 1

## 2024-02-22 MED ORDER — IPRATROPIUM BROMIDE 0.02 % IN SOLN
0.5000 mg | Freq: Four times a day (QID) | RESPIRATORY_TRACT | Status: DC
  Administered 2024-02-22: 0.5 mg via RESPIRATORY_TRACT
  Filled 2024-02-22: qty 2.5

## 2024-02-22 MED ORDER — HYDROCOD POLI-CHLORPHE POLI ER 10-8 MG/5ML PO SUER
5.0000 mL | Freq: Two times a day (BID) | ORAL | Status: DC | PRN
  Administered 2024-02-23 – 2024-02-25 (×6): 5 mL via ORAL
  Filled 2024-02-22 (×6): qty 5

## 2024-02-22 MED ORDER — ACETAMINOPHEN 325 MG PO TABS: 650.0000 mg | ORAL_TABLET | Freq: Four times a day (QID) | ORAL | Status: DC | PRN

## 2024-02-22 MED ORDER — PSEUDOEPHEDRINE HCL ER 120 MG PO TB12
120.0000 mg | ORAL_TABLET | Freq: Two times a day (BID) | ORAL | Status: DC
  Filled 2024-02-22 (×3): qty 1

## 2024-02-22 NOTE — ED Provider Notes (Signed)
  Physical Exam  BP 121/71   Pulse 97   Temp 98 F (36.7 C)   Resp (!) 21   Ht 5\' 5"  (1.651 m)   Wt 83 kg   SpO2 96%   BMI 30.45 kg/m   Physical Exam  Procedures  Procedures  ED Course / MDM    Medical Decision Making Amount and/or Complexity of Data Reviewed Labs: ordered. Radiology: ordered.  Risk Prescription drug management. Decision regarding hospitalization.  This patient's care was assumed by me at shift change. Here for cough and weakness X 1 week, CXR shows pleural effusion. The tests pending are CT chest. Plan is for admission after results.  CT chest with contrast has resulted and shows a small right pleural effusion with right lower lobe airspace disease favored to represent pneumonia, anterior inferior right upper and superior middle lobe consolidation likely pneumonia, radiology recommended antibiotic therapy and chest CT in 4 to 6 weeks   Patient was re-evaluated by me as well. I discussed their result with them and plan is for hospitalist for pneumonia.  Discussed discharge as patient's room air sats at the time were 96%, he is not tachycardic or febrile, normal BUN, he is low risk on curb 65 criteria.  His family at bedside states he is only able to walk a couple of steps with his walker without he take at least 15-second break and he has been more weak and having a lot of difficulty getting around since becoming ill about a week and a half ago and has been getting worse.  They do not feel comfortable with him going home, will for admission for pneumonia treatment.   On attempted ambublation sats stayed above 91%, but pt was very dyspneic per RN, unstable and "looked like he was about to pass out".  Patient was given Treatment, discussed with hospitalist Dr. Dareen Piano for admission.  She is agreeable to admission but did request D-dimer being adding which was ordered.          Josem Kaufmann 02/22/24 2201    Rondel Baton, MD 02/25/24  [phone]

## 2024-02-22 NOTE — ED Triage Notes (Signed)
 Pt arrived via POV from Dr Lamar Blinks office for further evaluation. Pt presents with a cough X 1 week, weakness, SOB, and possible left-sided pneumonia.

## 2024-02-22 NOTE — ED Notes (Signed)
 Pt ambulated on room air. Pt's oxygen stayed between 91%-95%.  Pt's breathing was labored, had coughing and c/o of sob.

## 2024-02-22 NOTE — Care Management Obs Status (Signed)
 MEDICARE OBSERVATION STATUS NOTIFICATION   Patient Details  Name: Peter Levy MRN: [id number] Date of Birth: 1956-02-16   Medicare Observation Status Notification Given:  Yes    Barron Alvine, RN 02/22/2024, 9:37 PM

## 2024-02-22 NOTE — H&P (Addendum)
 History and Physical    HASSELL PATRAS PPI:[id number] DOB: [birth year] DOA: 02/22/2024 PCP: [name], MD  Chief Complaint: dyspnea, cough Historian: patient, caregiver  HPI:  Peter Levy is a 68 y.o. male with a PMH significant for CP. At baseline, they live at a group home and are ambulatory with a walker and somewhat independent with ADLs.  They presented from group home with a caregiver to the ED on 02/22/2024 with cough x 7 days. Has been treated with tessalon pearles and azithromycin last Monday without improvement. Cough is significantly worse today.  Denies fever, abdominal pain, diarrhea, vomiting. He denies rhinorrhea. He is unable to provide further history. His caregiver at bedside states that he does not take any OTC treatments for his cough since it is not allowed at the group home. Denies any dysphagia or choking. Cough production is clear sputum. She states that he seems to have congestion but has not had anything come up with coughing or nasal drainage.  In the ED, it was found that they had stable vitals ORA but had significant dyspnea with exertion including tachycardia up to 120s.   Significant findings included: negative COVID, RSV, flu. D-dimer 1.86. WBC 8.2. unremarkable metabolic panel. LA 1.3 Chest xray: New small right pleural effusion with suspected loculation laterally and associated basilar atelectasis. Chest CT:  1. Moderate motion degradation throughout. 2. Small right pleural effusion with right lower lobe airspace disease, favored to represent pneumonia. A component of this could represent compressive atelectasis. 3. Anterior inferior right upper and superior right middle lobe areas of consolidation, also likely pneumonia. The anterior inferior right upper lobe component is somewhat more nodular and neoplasm is felt less likely. Consider appropriate antibiotic therapy and chest CT follow-up at 4-6 weeks. 4. 4 mm right middle lobe pulmonary nodule can  be presumed benign and do/does not warrant imaging follow-up per Fleischner criteria.  They were initially treated with azithromycin, ipratropium, CTX.   Patient was admitted to medicine service for further workup and management of CAP as outlined in detail below.  Assessment/Plan Principal Problem:   CAP (community acquired pneumonia) Active Problems:   Pneumonia of right upper lobe due to infectious organism   Multifocal CAP without hypoxia but had severe dyspnea with ambulation and tachycardia. Also has intractable coughing. R lobe consolidations on CT - continue IV azithromycin, CTX - full respiratory viral panel ordered - anti-tussives PRN and scheduled - ambulate with pulse ox tomorrow - although clinically appears to be infectious, with the acute onset and tachycardia that was not solved with PO abx outpatient and also has no leukocytosis.... want to rule out PE. Unfortunately, patient had CT chest so already has contrast today. Settle on getting d-dimer and if elevated, consider CTA chest tomorrow after repeat BMP - incentive spirometer for atelectasis seen on CT  CP - continue home buspar, tizanidine - PT/OT  Past Medical History:  Diagnosis Date   CP (cerebral palsy) (HCC)     Past Surgical History:  Procedure Laterality Date   COLONOSCOPY     FLEXIBLE SIGMOIDOSCOPY N/A 05/14/2018   Procedure: FLEXIBLE SIGMOIDOSCOPY;  Surgeon: [name], MD;  Location: [facility];  Service: Gastroenterology;  Laterality: N/A;   none     none     reports that he has never smoked. He has never used smokeless tobacco. He reports that he does not drink alcohol and does not use drugs.  No Known Allergies  History reviewed. No pertinent family history.  Prior to  Admission medications   Medication Sig Start Date End Date Taking? Authorizing Provider  albuterol (VENTOLIN HFA) 108 (90 Base) MCG/ACT inhaler Inhale 2 puffs into the lungs every 4 (four) hours as needed for wheezing  or shortness of breath. 08/28/21   Shon Hale, MD  benzonatate (TESSALON) 200 MG capsule Take 1 capsule (200 mg total) by mouth 2 (two) times daily as needed for cough. 08/28/21   Shon Hale, MD  BREZTRI AEROSPHERE 160-9-4.8 MCG/ACT AERO Inhale into the lungs. 09/29/22   [provider]  busPIRone (BUSPAR) 5 MG tablet Take 1 tablet (5 mg total) by mouth 2 (two) times daily. 08/28/21   Shon Hale, MD  Cholecalciferol (VITAMIN D3) 50 MCG (2000 UT) capsule Take 2,000 Units by mouth daily.    [provider]  Ergocalciferol (VITAMIN D2 PO) Take 1 tablet by mouth daily.    [provider]  ibuprofen (ADVIL) 800 MG tablet Take 1 tablet (800 mg total) by mouth 3 (three) times daily. 10/10/22   Smitty Knudsen, PA-C  tiZANidine (ZANAFLEX) 4 MG tablet Take 1 tablet (4 mg total) by mouth every 6 (six) hours as needed for muscle spasms. 10/10/22   Smitty Knudsen, PA-C  Vitamin D, Ergocalciferol, 50000 units CAPS Take 1 capsule by mouth once a week. 09/17/22   [provider]   I have personally, briefly reviewed patient's prior medical records in [facility]  Objective: Blood pressure 130/88, pulse (!) 112, temperature 98.7 F (37.1 C), resp. rate (!) 27, height 5\' 5"  (1.651 m), weight 83 kg, SpO2 95%.   Constitutional: NAD, calm, in apparent distress with intractable coughing.  HEENT: lids and conjunctivae normal. MMM. Posterior pharynx clear of any exudate or lesions. Normal dentition.  Neck: normal, supple, no masses, no thyromegaly Respiratory:  normal WOB at rest. Coughing is very frequent. Limits conversation and exam.  Cardiovascular: RRR, no murmurs  Skin: dry, intact, normal color, normal temperature on exposed skin Neurologic: Alert and oriented x 3. Grossly non-focal exam. PERRL Psychiatric: Normal mood. Congruent affect.  Labs on Admission: I have personally reviewed admission labs and imaging studies  CBC    Component Value  Date/Time   WBC 8.2 02/22/2024 1345   RBC 4.12 (L) 02/22/2024 1345   HGB 12.3 (L) 02/22/2024 1345   HCT 38.1 (L) 02/22/2024 1345   PLT 397 02/22/2024 1345   MCV 92.5 02/22/2024 1345   MCH 29.9 02/22/2024 1345   MCHC 32.3 02/22/2024 1345   RDW 12.0 02/22/2024 1345   LYMPHSABS 1.4 02/22/2024 1345   MONOABS 1.0 02/22/2024 1345   EOSABS 0.2 02/22/2024 1345   BASOSABS 0.0 02/22/2024 1345   CMP     Component Value Date/Time   NA 136 02/22/2024 1345   K 3.9 02/22/2024 1345   CL 102 02/22/2024 1345   CO2 23 02/22/2024 1345   GLUCOSE 102 (H) 02/22/2024 1345   BUN 16 02/22/2024 1345   CREATININE 1.02 02/22/2024 1345   CALCIUM 8.8 (L) 02/22/2024 1345   PROT 7.4 02/22/2024 1345   ALBUMIN 3.0 (L) 02/22/2024 1345   AST 35 02/22/2024 1345   ALT 43 02/22/2024 1345   ALKPHOS 39 02/22/2024 1345   BILITOT 0.9 02/22/2024 1345   GFRNONAA >60 02/22/2024 1345   GFRAA >60 05/10/2018 1303    Radiological Exams on Admission: CT Chest W Contrast Result Date: 02/22/2024 CLINICAL DATA:  Chronic dyspnea. EXAM: CT CHEST WITH CONTRAST TECHNIQUE: Multidetector CT imaging of the chest was performed during intravenous contrast  administration. RADIATION DOSE REDUCTION: This exam was performed according to the departmental dose-optimization program which includes automated exposure control, adjustment of the mA and/or kV according to patient size and/or use of iterative reconstruction technique. CONTRAST:  75mL OMNIPAQUE IOHEXOL 300 MG/ML  SOLN COMPARISON:  Chest radiograph of earlier today. FINDINGS: Cardiovascular: Moderate motion degradation throughout. Normal aortic caliber. Normal heart size, without pericardial effusion. Mediastinum/Nodes: No mediastinal or hilar adenopathy. Lungs/Pleura: Small right pleural effusion. Anterior inferior right upper lobe and subjacent right middle lobe probable consolidation. The anterior inferior component is somewhat more masslike, without traversing air bronchograms on 78/2.  Superior anterior right middle lobe 4 mm pulmonary nodule on 84/3. Dependent right lower lobe airspace disease. Upper Abdomen: Motion degradation continuing into the upper abdomen. No gross acute abnormality identified. Mildly prominent, gas-filled transverse colon. Musculoskeletal: No acute osseous abnormality. IMPRESSION: 1. Moderate motion degradation throughout. 2. Small right pleural effusion with right lower lobe airspace disease, favored to represent pneumonia. A component of this could represent compressive atelectasis. 3. Anterior inferior right upper and superior right middle lobe areas of consolidation, also likely pneumonia. The anterior inferior right upper lobe component is somewhat more nodular and neoplasm is felt less likely. Consider appropriate antibiotic therapy and chest CT follow-up at 4-6 weeks. 4. 4 mm right middle lobe pulmonary nodule can be presumed benign and do/does not warrant imaging follow-up per Fleischner criteria. Electronically Signed   By: Jeronimo Greaves M.D.   On: 02/22/2024 16:54   DG Chest 2 View Result Date: 02/22/2024 CLINICAL DATA:  Cough and shortness of breath. EXAM: CHEST - 2 VIEW COMPARISON:  Chest radiograph dated 12/30/2023. FINDINGS: The heart size and mediastinal contours are within normal limits. New small right pleural effusion with suspected loculated component along the lateral chest wall. There is associated right basilar atelectasis. The left lung appears clear. No definite pneumothorax. Dilated air-filled loops of bowel are again noted beneath the left hemidiaphragm. IMPRESSION: 1. New small right pleural effusion with suspected loculation laterally and associated basilar atelectasis. 2. Dilated air-filled loops of bowel are again noted beneath the left hemidiaphragm. Clinical correlation is recommended. Electronically Signed   By: Hart Robinsons M.D.   On: 02/22/2024 15:10   EKG: Independently reviewed. Sinus tachycardia  DVT prophylaxis: enoxaparin  (LOVENOX) injection 40 mg Start: 02/22/24 2000   Code Status: full  Family Communication: caregiver at bedside  Disposition Plan: admit to med-surg  Consults called: none   Leeroy Bock, DO [facility]  02/22/2024, 7:50 PM    To contact the appropriate [facility] Attending or Consulting provider: Check [email or web address] for coverage from 7pm-7am

## 2024-02-22 NOTE — ED Provider Notes (Cosign Needed Addendum)
 [place] EMERGENCY DEPARTMENT AT [facility] Provider Note   CSN: [id number] Arrival date & time: 02/22/24  1258     History  Chief Complaint  Patient presents with   Shortness of Breath    Peter Levy is a 68 y.o. male.  Patient is a 68 year old male with a past medical history of cerebral palsy who presents to the emergency department with a chief complaint of cough x 1 week, weakness.  He was evaluated his primary care doctor and sent to the emergency department for further evaluation.  Patient has had no associated chest pain, shortness of breath, abdominal pain, nausea, vomiting, diarrhea.  He denies any dizziness, lightheadedness or syncope.  He does admit to poor p.o. intake.   Shortness of Breath      Home Medications Prior to Admission medications   Medication Sig Start Date End Date Taking? Authorizing Provider  albuterol (VENTOLIN HFA) 108 (90 Base) MCG/ACT inhaler Inhale 2 puffs into the lungs every 4 (four) hours as needed for wheezing or shortness of breath. 08/28/21   Shon Hale, MD  benzonatate (TESSALON) 200 MG capsule Take 1 capsule (200 mg total) by mouth 2 (two) times daily as needed for cough. 08/28/21   Shon Hale, MD  BREZTRI AEROSPHERE 160-9-4.8 MCG/ACT AERO Inhale into the lungs. 09/29/22   [provider]  busPIRone (BUSPAR) 5 MG tablet Take 1 tablet (5 mg total) by mouth 2 (two) times daily. 08/28/21   Shon Hale, MD  Cholecalciferol (VITAMIN D3) 50 MCG (2000 UT) capsule Take 2,000 Units by mouth daily.    [provider]  Ergocalciferol (VITAMIN D2 PO) Take 1 tablet by mouth daily.    [provider]  ibuprofen (ADVIL) 800 MG tablet Take 1 tablet (800 mg total) by mouth 3 (three) times daily. 10/10/22   Smitty Knudsen, PA-C  tiZANidine (ZANAFLEX) 4 MG tablet Take 1 tablet (4 mg total) by mouth every 6 (six) hours as needed for muscle spasms. 10/10/22   Smitty Knudsen, PA-C  Vitamin D,  Ergocalciferol, 50000 units CAPS Take 1 capsule by mouth once a week. 09/17/22   [provider]      Allergies    Patient has no known allergies.    Review of Systems   Review of Systems  Respiratory:  Positive for shortness of breath.   All other systems reviewed and are negative.   Physical Exam Updated Vital Signs BP 121/71   Pulse (!) 105   Temp 98 F (36.7 C)   Resp (!) 24   Ht 5\' 5"  (1.651 m)   Wt 83 kg   SpO2 93%   BMI 30.45 kg/m  Physical Exam Vitals and nursing note reviewed.  Constitutional:      Appearance: Normal appearance.  HENT:     Head: Normocephalic and atraumatic.     Nose: Nose normal.     Mouth/Throat:     Mouth: Mucous membranes are moist.  Eyes:     Extraocular Movements: Extraocular movements intact.     Conjunctiva/sclera: Conjunctivae normal.     Pupils: Pupils are equal, round, and reactive to light.  Cardiovascular:     Rate and Rhythm: Normal rate and regular rhythm.     Pulses: Normal pulses.     Heart sounds: Normal heart sounds.  Pulmonary:     Effort: Pulmonary effort is normal. No tachypnea or respiratory distress.     Breath sounds: Examination of the right-lower field reveals rales. Rales  present. No decreased breath sounds, wheezing or rhonchi.  Chest:     Chest wall: No tenderness.  Abdominal:     General: Abdomen is flat. Bowel sounds are normal.     Palpations: Abdomen is soft. There is no hepatomegaly or mass.     Tenderness: There is no abdominal tenderness.  Musculoskeletal:        General: Normal range of motion.     Cervical back: Normal range of motion and neck supple.     Right lower leg: No edema.     Left lower leg: No edema.  Skin:    General: Skin is warm and dry.  Neurological:     General: No focal deficit present.     Mental Status: He is alert and oriented to person, place, and time. Mental status is at baseline.  Psychiatric:        Mood and Affect: Mood normal.        Behavior: Behavior  normal.        Thought Content: Thought content normal.        Judgment: Judgment normal.     ED Results / Procedures / Treatments   Labs (all labs ordered are listed, but only abnormal results are displayed) Labs Reviewed  CBC WITH DIFFERENTIAL/PLATELET - Abnormal; Notable for the following components:      Result Value   RBC 4.12 (*)    Hemoglobin 12.3 (*)    HCT 38.1 (*)    All other components within normal limits  BASIC METABOLIC PANEL WITH GFR - Abnormal; Notable for the following components:   Glucose, Bld 102 (*)    Calcium 8.8 (*)    All other components within normal limits  HEPATIC FUNCTION PANEL - Abnormal; Notable for the following components:   Albumin 3.0 (*)    All other components within normal limits  RESP PANEL BY RT-PCR (RSV, FLU A&B, COVID)  RVPGX2  CULTURE, BLOOD (ROUTINE X 2)  CULTURE, BLOOD (ROUTINE X 2)  LACTIC ACID, PLASMA  BRAIN NATRIURETIC PEPTIDE  LACTIC ACID, PLASMA    EKG None  Radiology No results found.  Procedures Procedures    Medications Ordered in ED Medications  albuterol (VENTOLIN HFA) 108 (90 Base) MCG/ACT inhaler 2 puff (has no administration in time range)  sodium chloride 0.9 % bolus 1,000 mL (1,000 mLs Intravenous New Bag/Given 02/22/24 1353)    ED Course/ Medical Decision Making/ A&P                                 Medical Decision Making Patient does remain stable at this time.  Discussed with patient and family that chest x-ray demonstrates a right-sided pleural effusion.  Will obtain CT scan of the chest for further evaluation of this as x-ray does comment on possible loculated fluid.  He has no associated leukocytosis and negative lactic acid.  Vital signs have been otherwise stable at this point and patient is resting comfortably at the bedside. Will sign patient out to [facility], PA-C at 1700 on 02/22/24.   Amount and/or Complexity of Data Reviewed Labs: ordered. Radiology: ordered.  Risk Prescription  drug management.           Final Clinical Impression(s) / ED Diagnoses Final diagnoses:  None    Rx / DC Orders ED Discharge Orders     None         Lelon Perla, PA-C 02/22/24  [address]    Kathlen Mody 02/22/24 1643    Bethann Berkshire, MD 02/24/24 [phone]

## 2024-02-22 NOTE — Progress Notes (Signed)
   02/22/24 2137  TOC Brief Assessment  Insurance and Status Reviewed  Patient has primary care physician Yes  Home environment has been reviewed From home  Prior level of function: Moderate assistance from family  Prior/Current Home Services No current home services  Social Drivers of Health Review SDOH reviewed no interventions necessary  Readmission risk has been reviewed Yes  Transition of care needs no transition of care needs at this time   Transition of Care Department [facility]) has reviewed patient and no other TOC needs have been identified at this time. We will continue to monitor patient advancement through interdisciplinary progression rounds. If new patient needs arise, please place a TOC consult.

## 2024-02-23 DIAGNOSIS — Z79899 Other long term (current) drug therapy: Secondary | ICD-10-CM | POA: Diagnosis not present

## 2024-02-23 DIAGNOSIS — Z7951 Long term (current) use of inhaled steroids: Secondary | ICD-10-CM | POA: Diagnosis not present

## 2024-02-23 DIAGNOSIS — R000 Tachycardia, unspecified: Secondary | ICD-10-CM | POA: Diagnosis present

## 2024-02-23 DIAGNOSIS — J690 Pneumonitis due to inhalation of food and vomit: Secondary | ICD-10-CM | POA: Diagnosis present

## 2024-02-23 DIAGNOSIS — D649 Anemia, unspecified: Secondary | ICD-10-CM | POA: Diagnosis present

## 2024-02-23 DIAGNOSIS — Z1152 Encounter for screening for COVID-19: Secondary | ICD-10-CM | POA: Diagnosis not present

## 2024-02-23 DIAGNOSIS — R0609 Other forms of dyspnea: Secondary | ICD-10-CM | POA: Diagnosis present

## 2024-02-23 DIAGNOSIS — J918 Pleural effusion in other conditions classified elsewhere: Secondary | ICD-10-CM | POA: Diagnosis present

## 2024-02-23 DIAGNOSIS — G809 Cerebral palsy, unspecified: Secondary | ICD-10-CM | POA: Diagnosis present

## 2024-02-23 DIAGNOSIS — J189 Pneumonia, unspecified organism: Secondary | ICD-10-CM | POA: Diagnosis present

## 2024-02-23 DIAGNOSIS — R1310 Dysphagia, unspecified: Secondary | ICD-10-CM | POA: Diagnosis present

## 2024-02-23 DIAGNOSIS — Z23 Encounter for immunization: Secondary | ICD-10-CM | POA: Diagnosis present

## 2024-02-23 DIAGNOSIS — J9811 Atelectasis: Secondary | ICD-10-CM | POA: Diagnosis present

## 2024-02-23 LAB — CBC
HCT: 36.6 % — ABNORMAL LOW (ref 39.0–52.0)
Hemoglobin: 11.7 g/dL — ABNORMAL LOW (ref 13.0–17.0)
MCH: 30.1 pg (ref 26.0–34.0)
MCHC: 32 g/dL (ref 30.0–36.0)
MCV: 94.1 fL (ref 80.0–100.0)
Platelets: 375 10*3/uL (ref 150–400)
RBC: 3.89 MIL/uL — ABNORMAL LOW (ref 4.22–5.81)
RDW: 12 % (ref 11.5–15.5)
WBC: 9 10*3/uL (ref 4.0–10.5)
nRBC: 0 % (ref 0.0–0.2)

## 2024-02-23 LAB — RESPIRATORY PANEL BY PCR

## 2024-02-23 LAB — BASIC METABOLIC PANEL WITH GFR
Anion gap: 10 (ref 5–15)
BUN: 15 mg/dL (ref 8–23)
CO2: 24 mmol/L (ref 22–32)
Calcium: 8.6 mg/dL — ABNORMAL LOW (ref 8.9–10.3)
Chloride: 105 mmol/L (ref 98–111)
Creatinine, Ser: 1.05 mg/dL (ref 0.61–1.24)
GFR, Estimated: >60 mL/min (ref >60)
Glucose, Bld: 93 mg/dL (ref 70–99)
Potassium: 4 mmol/L (ref 3.5–5.1)
Sodium: 139 mmol/L (ref 135–145)

## 2024-02-23 MED ORDER — BENZONATATE 100 MG PO CAPS
200.0000 mg | ORAL_CAPSULE | Freq: Three times a day (TID) | ORAL | Status: DC | PRN
Start: 2024-02-23 — End: 2024-02-26
  Administered 2024-02-23 – 2024-02-25 (×4): 200 mg via ORAL
  Filled 2024-02-23 (×4): qty 2

## 2024-02-23 MED ORDER — AZITHROMYCIN 250 MG PO TABS
250.0000 mg | ORAL_TABLET | Freq: Every day | ORAL | Status: AC
  Administered 2024-02-23 – 2024-02-26 (×4): 250 mg via ORAL
  Filled 2024-02-23 (×4): qty 1

## 2024-02-23 MED ORDER — PSEUDOEPHEDRINE HCL 60 MG PO TABS
60.0000 mg | ORAL_TABLET | Freq: Four times a day (QID) | ORAL | Status: DC
  Filled 2024-02-23 (×6): qty 1

## 2024-02-23 NOTE — Evaluation (Signed)
 Physical Therapy Evaluation Patient Details Name: Peter Levy MRN: [id number] DOB: 1956-09-22 Today's Date: 02/23/2024  History of Present Illness  Peter Levy is a 68 y.o. male with a PMH significant for CP.  At baseline, they live at a group home and are ambulatory with a walker and somewhat independent with ADLs. They presented from group home with a caregiver to the ED on 02/22/2024 with cough x 7 days. Has been treated with tessalon pearles and azithromycin last Monday without improvement. Cough is significantly worse today.   Denies fever, abdominal pain, diarrhea, vomiting. He denies rhinorrhea.  He is unable to provide further history. His caregiver at bedside states that he does not take any OTC treatments for his cough since it is not allowed at the group home. Denies any dysphagia or choking. Cough production is clear sputum. She states that he seems to have congestion but has not had anything come up with coughing or nasal drainage.  Clinical Impression  Pt demonstrates decreased safety with gait and increased weakness as indicated by need for assist to stand and decreased strength noted in BLE. Pt was independent with mobility aside from caregiver supervision prior to admit and requires physical assist at this time. Recommend continued PT skilled services for improving functional gait back to baseline.         If plan is discharge home, recommend the following: A lot of help with walking and/or transfers;A lot of help with bathing/dressing/bathroom;Assist for transportation;Help with stairs or ramp for entrance   Can travel by private vehicle        Equipment Recommendations    Recommendations for Other Services       Functional Status Assessment Patient has had a recent decline in their functional status and demonstrates the ability to make significant improvements in function in a reasonable and predictable amount of time.     Precautions / Restrictions  Precautions Precautions: Fall Precaution/Restrictions Comments: hx cerebral palsy      Mobility  Bed Mobility Overal bed mobility: Modified Independent             General bed mobility comments: increased time/supervision    Transfers Overall transfer level: Needs assistance Equipment used: Rollator (4 wheels) Transfers: Sit to/from Stand Sit to Stand: Min assist           General transfer comment: increased time for repetition to stand    Ambulation/Gait Ambulation/Gait assistance: Contact guard assist, Min assist Gait Distance (Feet): 30 Feet Assistive device: Rollator (4 wheels) Gait Pattern/deviations: Knee flexed in stance - right, Decreased stance time - right, Trunk flexed, Drifts right/left       General Gait Details: R LE spasticity and decreased ROM; lateral lean to the R  Stairs            Wheelchair Mobility     Tilt Bed    Modified Rankin (Stroke Patients Only)       Balance Overall balance assessment: Needs assistance   Sitting balance-Leahy Scale: Good       Standing balance-Leahy Scale: Poor                               Pertinent Vitals/Pain Pain Assessment Pain Assessment: No/denies pain    Home Living Family/patient expects to be discharged to:: Group home Living Arrangements: Group Home Available Help at Discharge: Personal care attendant;Available 24 hours/day Type of Home: Group Home Home Access: Ramped entrance  Home Equipment: Rollator (4 wheels)      Prior Function Prior Level of Function : Needs assist  Cognitive Assist : Mobility (cognitive);ADLs (cognitive)     Physical Assist : Mobility (physical);ADLs (physical)     Mobility Comments: utilizes rollator and assist from other caregivers in group home around 24/7       Extremity/Trunk Assessment        Lower Extremity Assessment Lower Extremity Assessment: RLE deficits/detail RLE Deficits / Details: increased  spasticity compared to L side; decreased knee extension/ankle DF compared to L       Communication        Cognition                                         Cueing       General Comments      Exercises     Assessment/Plan    PT Assessment Patient needs continued PT services  PT Problem List Decreased strength;Decreased mobility;Decreased range of motion;Decreased activity tolerance;Decreased balance;Impaired tone       PT Treatment Interventions Gait training;Stair training;Functional mobility training;Therapeutic activities;Therapeutic exercise;Balance training;Neuromuscular re-education;Patient/family education    PT Goals (Current goals can be found in the Care Plan section)  Acute Rehab PT Goals Patient Stated Goal: "Get back out in the community" PT Goal Formulation: With patient/family Time For Goal Achievement: 03/08/24 Potential to Achieve Goals: Good    Frequency Min 3X/week     Co-evaluation               AM-PAC PT "6 Clicks" Mobility  Outcome Measure Help needed turning from your back to your side while in a flat bed without using bedrails?: A Little Help needed moving from lying on your back to sitting on the side of a flat bed without using bedrails?: A Little Help needed moving to and from a bed to a chair (including a wheelchair)?: A Little Help needed standing up from a chair using your arms (e.g., wheelchair or bedside chair)?: A Little Help needed to walk in hospital room?: A Little Help needed climbing 3-5 steps with a railing? : A Lot 6 Click Score: 17    End of Session Equipment Utilized During Treatment: Gait belt Activity Tolerance: Patient tolerated treatment well Patient left: in chair;with family/visitor present Nurse Communication: Mobility status PT Visit Diagnosis: Unsteadiness on feet (R26.81)    Time: 1478-2956 PT Time Calculation (min) (ACUTE ONLY): 15 min   Charges:   PT Evaluation $PT Eval Moderate  Complexity: 1 Mod   PT General Charges $$ ACUTE PT VISIT: 1 Visit         Placido Sou PT, DPT [facility] [place] 336 [phone] office   Dalbert Mayotte Fendi Meinhardt 02/23/2024, 1:57 PM

## 2024-02-23 NOTE — Hospital Course (Signed)
 68 y.o. M with cerebral palsy presented from PCP's office for suspected pneumonia.  In the ER, CXR suggested loculated effusion, but CT showed only small effusion with RLL pneumonia.  Admitted on antibiotics.

## 2024-02-23 NOTE — Assessment & Plan Note (Signed)
 CT chest shows RUL and RML pneumonia with small pleural effusion. - Continue Rocephin and azithromycin - RT consult for chest vest - Consult SLP given ?aspiration - Antitussives

## 2024-02-23 NOTE — Progress Notes (Signed)
   02/23/24 0817  Vitals  Temp 98.1 F (36.7 C)  Temp Source Axillary  BP 112/72  MAP (mmHg) 84  BP Location Left Arm  BP Method Automatic  Patient Position (if appropriate) Lying  Pulse Rate (!) 109  Pulse Rate Source Dinamap  Resp (!) 22  Level of Consciousness  Level of Consciousness Alert  MEWS COLOR  MEWS Score Color Yellow  Oxygen Therapy  SpO2 95 %  O2 Device Room Air  Pain Assessment  Pain Scale 0-10  Pain Score 0  Complaints & Interventions  Complains of Coughing  Interventions Medication (see MAR)  Patients response to intervention Decreased  PCA/Epidural/Spinal Assessment  Respiratory Pattern Regular  MEWS Score  MEWS Temp 0  MEWS Systolic 0  MEWS Pulse 1  MEWS RR 1  MEWS LOC 0  MEWS Score 2  Provider Notification  Provider Name/Title Danford  Date Provider Notified 02/23/24  Time Provider Notified [phone]  Method of Notification Page  Notification Reason Other (Comment)  Provider response No new orders  Date of Provider Response 02/23/24  Time of Provider Response 0845   MD at bedside.

## 2024-02-23 NOTE — TOC Initial Note (Signed)
 Transition of Care [facility]) - Initial/Assessment Note    Patient Details  Name: Peter Levy MRN: [id number] Date of Birth: [birth year]  Transition of [facility]) CM/SW Contact:    Elliot Gault, LCSW Phone Number: 02/23/2024, 10:41 AM  Clinical Narrative:                  Pt admitted from [facility]. Plan is for return there at dc. MD anticipating dc in a few days. PT eval pending.  Spoke with Hallie at the Group Home today to update and review dc planning. Hallie states pt is normally able to ambulate independently with his walker. Staff provides minimal assistance for ADLs at baseline. Per [facility], they are agreeable to [facility] PT/OT if recommended. They do not have a HH agency preference.  TOC will follow and further assess and assist with dc planning after PT eval completed.   Expected Discharge Plan: Group Home Barriers to Discharge: Continued Medical Work up   Patient Goals and CMS Choice Patient states their goals for this hospitalization and ongoing recovery are:: get better          Expected Discharge Plan and Services In-house Referral: Clinical Social Work     Living arrangements for the past 2 months: Group Home                                      Prior Living Arrangements/Services Living arrangements for the past 2 months: Group Home Lives with:: Facility Resident Patient language and need for interpreter reviewed:: Yes Do you feel safe going back to the place where you live?: Yes      Need for Family Participation in Patient Care: No (Comment) Care giver support system in place?: Yes (comment) Current home services: DME Criminal Activity/Legal Involvement Pertinent to Current Situation/Hospitalization: No - Comment as needed  Activities of Daily Living   ADL Screening (condition at time of admission) Independently performs ADLs?: No Does the patient have a NEW difficulty with bathing/dressing/toileting/self-feeding that is  expected to last >3 days?: No Does the patient have a NEW difficulty with getting in/out of bed, walking, or climbing stairs that is expected to last >3 days?: No Does the patient have a NEW difficulty with communication that is expected to last >3 days?: No Is the patient deaf or have difficulty hearing?: No Does the patient have difficulty seeing, even when wearing glasses/contacts?: No Does the patient have difficulty concentrating, remembering, or making decisions?: Yes  Permission Sought/Granted                  Emotional Assessment       Orientation: : Oriented to Self, Oriented to Place, Oriented to  Time, Oriented to Situation Alcohol / Substance Use: Not Applicable Psych Involvement: No (comment)  Admission diagnosis:  CAP (community acquired pneumonia) [J18.9] Pneumonia of right upper lobe due to infectious organism [J18.9] Patient Active Problem List   Diagnosis Date Noted   CAP (community acquired pneumonia) 02/22/2024   Pneumonia of right upper lobe due to infectious organism 02/22/2024   Rhabdomyolysis 08/27/2021   Cerebral palsy (HCC) 08/27/2021   Special screening for malignant neoplasms, colon    Failed attempted surgical procedure    PCP:  [name], MD Pharmacy:   [facility] [place] - [address] [place] [address] Phone: [phone] Fax: [phone]  Social Drivers of Health (SDOH) Social History: SDOH Screenings   Food Insecurity: No Food Insecurity (02/22/2024)  Housing: Low Risk  (02/22/2024)  Transportation Needs: No Transportation Needs (02/22/2024)  Utilities: Not At Risk (02/22/2024)  Social Connections: Socially Isolated (02/22/2024)  Tobacco Use: Low Risk  (02/22/2024)   SDOH Interventions:     Readmission Risk Interventions     No data to display

## 2024-02-23 NOTE — Progress Notes (Signed)
   02/23/24 0835  Vitals  Temp 98.3 F (36.8 C)  Temp Source Oral  BP (!) 119/92  MAP (mmHg) 100  BP Location Left Arm  BP Method Automatic  Patient Position (if appropriate) Lying  Pulse Rate (!) 110  Pulse Rate Source Dinamap  Resp 20  Level of Consciousness  Level of Consciousness Alert  MEWS COLOR  MEWS Score Color Green  Oxygen Therapy  SpO2 95 %  O2 Device Room Air  Pain Assessment  Pain Scale 0-10  Pain Score 0  Complaints & Interventions  Complains of Coughing  Interventions Medication (see MAR)  Patients response to intervention Decreased  PCA/Epidural/Spinal Assessment  Respiratory Pattern Regular  MEWS Score  MEWS Temp 0  MEWS Systolic 0  MEWS Pulse 1  MEWS RR 0  MEWS LOC 0  MEWS Score 1  Provider Notification  Provider Name/Title danford  Date Provider Notified 02/23/24  Time Provider Notified [phone]  Method of Notification Page  Notification Reason Other (Comment)  Provider response No new orders  Date of Provider Response 02/23/24  Time of Provider Response [phone]  Note  Patient Observations New VS taken cough med given

## 2024-02-23 NOTE — Plan of Care (Signed)
  Problem: Acute Rehab PT Goals(only PT should resolve) Goal: Pt Will Go Sit To Supine/Side Flowsheets (Taken 02/23/2024 1409) Pt will go Sit to Supine/Side: Independently Goal: Patient Will Transfer Sit To/From Stand Flowsheets (Taken 02/23/2024 1409) Patient will transfer sit to/from stand: with supervision Goal: Pt Will Transfer Bed To Chair/Chair To Bed Flowsheets (Taken 02/23/2024 1409) Pt will Transfer Bed to Chair/Chair to Bed: with supervision Note: W/ rollator Goal: Pt Will Ambulate Flowsheets (Taken 02/23/2024 1409) Pt will Ambulate:  > 125 feet  with supervision  with least restrictive assistive device   Placido Sou PT, DPT [facility] [place] 336 [phone] office

## 2024-02-23 NOTE — Plan of Care (Signed)

## 2024-02-23 NOTE — Evaluation (Signed)
 Clinical/Bedside Swallow Evaluation Patient Details  Name: Peter Levy MRN: [id number] Date of Birth: [birth year]  Today's Date: 02/23/2024 Time: SLP Start Time (ACUTE ONLY): 1255 SLP Stop Time (ACUTE ONLY): 1318 SLP Time Calculation (min) (ACUTE ONLY): 23 min  Past Medical History:  Past Medical History:  Diagnosis Date   CP (cerebral palsy) (HCC)    Past Surgical History:  Past Surgical History:  Procedure Laterality Date   COLONOSCOPY     FLEXIBLE SIGMOIDOSCOPY N/A 05/14/2018   Procedure: FLEXIBLE SIGMOIDOSCOPY;  Surgeon: [name], MD;  Location: [facility];  Service: Gastroenterology;  Laterality: N/A;   none     none   HPI:  LOYS Levy is a 68 y.o. male with a PMH significant for CP.  At baseline, they live at a group home and are ambulatory with a walker and somewhat independent with ADLs. They presented from group home with a caregiver to the ED on 02/22/2024 with cough x 7 days. Has been treated with tessalon pearles and azithromycin last Monday without improvement. Cough is significantly worse today.   Denies fever, abdominal pain, diarrhea, vomiting. He denies rhinorrhea.  He is unable to provide further history. His caregiver at bedside states that he does not take any OTC treatments for his cough since it is not allowed at the group home. Denies any dysphagia or choking. Cough production is clear sputum. She states that he seems to have congestion but has not had anything come up with coughing or nasal drainage. BSE requested.    Assessment / Plan / Recommendation  Clinical Impression  Clinical swallow evaluation completed with Pt while he was seated upright in his chair with caregiver from his group home present. Pt has CP, so does present with reduced coordination with oral motor movements, but strength and ROM appear WNL. Pt assessed with thin water, NTL, and puree. Solid food trials were not presented this date due to excessive coughing noted per caregiver, Pt, and  staff with solids earlier. His caregiver indicates that he typically does not have trouble with coughing while eating/drinking, but this has been occurring with this current episode of PNA. Pt was cued to cough prior to being given PO to assess for cough strength and this created a long and strong coughing fit. Caregiver reported this also occurred when he was trying to chew his meat from lunch earlier today and he expectorated it. Pt with occasional delayed cough after sequential straw sips thin liquid (he always uses a straw per caregiver) and difficult to determine if directly related to PO intake, however suspect reduced coordination between respiration and swallow at this time. No coughing observed with straw sips of NTL. Recommend D1/puree and NTL with SLP to follow tomorrow to determine if appropriate for upgrades or need for instrumental assessment. Pt and caregiver are in agreement with plan of care. OK for PO medications whole with NTL or puree.   SLP Visit Diagnosis: Dysphagia, unspecified (R13.10)    Aspiration Risk  Mild aspiration risk    Diet Recommendation Dysphagia 1 (Puree);Nectar-thick liquid    Liquid Administration via: Straw Medication Administration: Whole meds with liquid Supervision: Patient able to self feed;Intermittent supervision to cue for compensatory strategies Compensations: Slow rate;Small sips/bites Postural Changes: Seated upright at 90 degrees;Remain upright for at least 30 minutes after po intake    Other  Recommendations Oral Care Recommendations: Oral care BID;Staff/trained caregiver to provide oral care Caregiver Recommendations: Remove water pitcher;Avoid jello, ice cream, thin soups, popsicles  Recommendations for follow up therapy are one component of a multi-disciplinary discharge planning process, led by the attending physician.  Recommendations may be updated based on patient status, additional functional criteria and insurance  authorization.  Follow up Recommendations Follow physician's recommendations for discharge plan and follow up therapies      Assistance Recommended at Discharge    Functional Status Assessment Patient has had a recent decline in their functional status and demonstrates the ability to make significant improvements in function in a reasonable and predictable amount of time.  Frequency and Duration min 2x/week  1 week       Prognosis Prognosis for improved oropharyngeal function: Good      Swallow Study   General Date of Onset: 02/22/24 HPI: Peter Levy is a 68 y.o. male with a PMH significant for CP.  At baseline, they live at a group home and are ambulatory with a walker and somewhat independent with ADLs. They presented from group home with a caregiver to the ED on 02/22/2024 with cough x 7 days. Has been treated with tessalon pearles and azithromycin last Monday without improvement. Cough is significantly worse today.   Denies fever, abdominal pain, diarrhea, vomiting. He denies rhinorrhea.  He is unable to provide further history. His caregiver at bedside states that he does not take any OTC treatments for his cough since it is not allowed at the group home. Denies any dysphagia or choking. Cough production is clear sputum. She states that he seems to have congestion but has not had anything come up with coughing or nasal drainage. BSE requested. Type of Study: Bedside Swallow Evaluation Previous Swallow Assessment: n/a Diet Prior to this Study: Dysphagia 3 (mechanical soft);Thin liquids (Level 0) Temperature Spikes Noted: No Respiratory Status: Room air History of Recent Intubation: No Behavior/Cognition: Alert;Cooperative;Pleasant mood Oral Cavity Assessment: Within Functional Limits Oral Care Completed by SLP: Recent completion by staff Oral Cavity - Dentition: Missing dentition;Adequate natural dentition Vision: Functional for self-feeding Self-Feeding Abilities: Able to feed  self Patient Positioning: Upright in chair Baseline Vocal Quality: Normal Volitional Cough: Strong Volitional Swallow: Able to elicit    Oral/Motor/Sensory Function Overall Oral Motor/Sensory Function: Within functional limits   Ice Chips Ice chips: Not tested   Thin Liquid Thin Liquid: Impaired Presentation: Straw Pharyngeal  Phase Impairments: Cough - Delayed    Nectar Thick Nectar Thick Liquid: Within functional limits Presentation: Straw   Honey Thick Honey Thick Liquid: Not tested   Puree Puree: Within functional limits Presentation: Spoon   Solid     Solid: Not tested     Thank you,  Havery Moros, CCC-SLP [phone]  Quante Pettry 02/23/2024,3:21 PM

## 2024-02-23 NOTE — Progress Notes (Signed)
  Progress Note   Patient: Peter Levy ZOX:[id number] DOB: 1956-10-07 DOA: 02/22/2024     0 DOS: the patient was seen and examined on 02/23/2024 at 8:30AM      Brief hospital course: 68 y.o. M with cerebral palsy presented from PCP's office for suspected pneumonia.  In the ER, CXR suggested loculated effusion, but CT showed only small effusion with RLL pneumonia.  Admitted on antibiotics.     Assessment and Plan: * Multifocal pneumonia CT chest shows RUL and RML pneumonia with small pleural effusion. - Continue Rocephin and azithromycin - RT consult for chest vest - Consult SLP given ?aspiration - Antitussives    Normocytic anemia    Cerebral palsy (HCC)            Subjective: Patient coughing frequently, aspirated slightly on breakfast.  No fever, no confusion.     Physical Exam: BP (!) 132/58 (BP Location: Left Arm)   Pulse 93   Temp 98.2 F (36.8 C) (Oral)   Resp 19   Ht 5\' 5"  (1.651 m)   Wt 71.4 kg   SpO2 97%   BMI 26.19 kg/m   Thin adult male, lying in bed, responsive and interactive Tachycardic, regular, no mrumurs, no LE edema Respiratory rate normal, lungs diminished, I do not appear rales Abdomen soft, no TTP Attention normal, affect normal, spasticity in voice and in all four extremities, oriented to person, place and time.  Data Reviewed: BMP unremarkable CBC shows mild anemia CT chest showed RUL and RML pneumonia  Family Communication: None present    Disposition: Status is: Inpatient 68 yo M with CP who presented with pneumonia  Continue IV antibiotics, possible discharge in 2-3 days        Author: Alberteen Sam, MD 02/23/2024 5:07 PM  For on call review [email or web address].

## 2024-02-24 ENCOUNTER — Inpatient Hospital Stay (HOSPITAL_COMMUNITY)

## 2024-02-24 DIAGNOSIS — G809 Cerebral palsy, unspecified: Secondary | ICD-10-CM | POA: Diagnosis not present

## 2024-02-24 DIAGNOSIS — J189 Pneumonia, unspecified organism: Secondary | ICD-10-CM | POA: Diagnosis not present

## 2024-02-24 LAB — CBC
HCT: 38.5 % — ABNORMAL LOW (ref 39.0–52.0)
Hemoglobin: 12 g/dL — ABNORMAL LOW (ref 13.0–17.0)
MCH: 29.7 pg (ref 26.0–34.0)
MCHC: 31.2 g/dL (ref 30.0–36.0)
MCV: 95.3 fL (ref 80.0–100.0)
Platelets: 395 10*3/uL (ref 150–400)
RBC: 4.04 MIL/uL — ABNORMAL LOW (ref 4.22–5.81)
RDW: 11.9 % (ref 11.5–15.5)
WBC: 8.3 10*3/uL (ref 4.0–10.5)
nRBC: 0 % (ref 0.0–0.2)

## 2024-02-24 LAB — BASIC METABOLIC PANEL WITH GFR
Anion gap: 10 (ref 5–15)
BUN: 15 mg/dL (ref 8–23)
CO2: 26 mmol/L (ref 22–32)
Calcium: 8.8 mg/dL — ABNORMAL LOW (ref 8.9–10.3)
Chloride: 104 mmol/L (ref 98–111)
Creatinine, Ser: 0.94 mg/dL (ref 0.61–1.24)
GFR, Estimated: >60 mL/min (ref >60)
Glucose, Bld: 93 mg/dL (ref 70–99)
Potassium: 3.9 mmol/L (ref 3.5–5.1)
Sodium: 140 mmol/L (ref 135–145)

## 2024-02-24 MED ORDER — CEFUROXIME AXETIL 250 MG PO TABS
500.0000 mg | ORAL_TABLET | Freq: Two times a day (BID) | ORAL | Status: DC
  Administered 2024-02-24 – 2024-02-26 (×4): 500 mg via ORAL
  Filled 2024-02-24 (×4): qty 2

## 2024-02-24 MED ORDER — IPRATROPIUM-ALBUTEROL 0.5-2.5 (3) MG/3ML IN SOLN
3.0000 mL | Freq: Three times a day (TID) | RESPIRATORY_TRACT | Status: DC
  Administered 2024-02-24 – 2024-02-26 (×6): 3 mL via RESPIRATORY_TRACT
  Filled 2024-02-24 (×6): qty 3

## 2024-02-24 NOTE — Progress Notes (Signed)
 Mobility Specialist Progress Note:    02/24/24 1130  Mobility  Activity Transferred from bed to chair  Level of Assistance Minimal assist, patient does 75% or more  Assistive Device None  Distance Ambulated (ft) 2 ft  Range of Motion/Exercises Active;All extremities  Activity Response Tolerated well  Mobility Referral No  Mobility visit 1 Mobility  Mobility Specialist Start Time (ACUTE ONLY) 1115  Mobility Specialist Stop Time (ACUTE ONLY) 1130  Mobility Specialist Time Calculation (min) (ACUTE ONLY) 15 min   Pt received in bed, nurse requesting assistance to transfer. Required MinA to stand and transfer with no AD. Tolerated well, asx throughout. Left pt in chair with family in room. All needs met.  Peter Levy Mobility Specialist Please contact via Special educational needs teacher or  Rehab office at [phone]

## 2024-02-24 NOTE — Evaluation (Signed)
 Modified Barium Swallow Study  Patient Details  Name: Peter Levy MRN: [id number] Date of Birth: 03-Dec-1955  Today's Date: 02/24/2024  Modified Barium Swallow completed.  Full report located under Chart Review in the Imaging Section.  History of Present Illness Peter Levy is a 68 y.o. male with a PMH significant for CP.  At baseline, they live at a group home and are ambulatory with a walker and somewhat independent with ADLs. They presented from group home with a caregiver to the ED on 02/22/2024 with cough x 7 days. Has been treated with tessalon pearles and azithromycin last Monday without improvement. Cough is significantly worse today.   Denies fever, abdominal pain, diarrhea, vomiting. He denies rhinorrhea.  He is unable to provide further history. His caregiver at bedside states that he does not take any OTC treatments for his cough since it is not allowed at the group home. Denies any dysphagia or choking. Cough production is clear sputum. She states that he seems to have congestion but has not had anything come up with coughing or nasal drainage. BSE complete; recommending MBSS to objectively assess the swallowing function   Clinical Impression Pt presents with mild/moderate oropharyngeal dysphagia characterized by occasional aspiration of thin liquids that results in delayed coughing episodes that are ineffective in clearing aspirates. Pt demonstrates some behavioral differences when swallowing: throwing his head back when thin liquids are in his mouth and jerky movements prior to triggering a swallow. Aspiration (min to moderate amounts) noted with large bolus presentations of thin when liquids fill the pharynx and fall to the level of the pyriforms before a delayed swallow is triggered; aspiration occured during the swallow. With small cup sips of thin only occasional trace aspiration was noted. Suspect jerking movements/behavioral differences with swallowing and current decompensated  status increase risk of aspiration. No aspiration was noted with other trials (NTL, HTL, puree, barium tablet or regular). Pt demonstrated some jerking and movement with other PO trials but most severely noted with thin liquids which again seemed to negatively impact airway protection. With solid textures note decreased coordination and nursing and family report prolonged coughing with solid trials. Recommend D1/puree and NECTAR thick liquids. Recommend meds be administered whole with NTL - straw with NTL is ok as Pt has difficulty coordinating cup sips. ST will continue to follow acutely and recommend ST f/u at d/c location and consider repeat instrumental assessment when clinically indicated. Thank you, Factors that may increase risk of adverse event in presence of aspiration Rubye Oaks & Clearance Coots 2021):    Swallow Evaluation Recommendations Recommendations: PO diet PO Diet Recommendation: Dysphagia 1 (Pureed);Thin liquids (Level 0) Liquid Administration via: Cup;Straw (straw with NECTAR) Medication Administration: Crushed with puree Supervision: Patient able to self-feed;Staff to assist with self-feeding Swallowing strategies  : Small bites/sips Postural changes: Position pt fully upright for meals Oral care recommendations: Oral care BID (2x/day) Caregiver Recommendations: Remove water pitcher;Avoid jello, ice cream, thin soups, popsicles    Josejulian Tarango H. Romie Levee, CCC-SLP Speech Language Pathologist   Georgetta Haber 02/24/2024,4:55 PM

## 2024-02-24 NOTE — Progress Notes (Signed)
 [facility]  PROGRESS NOTE  Peter Levy IHK:[id number] DOB: 1956/08/30 DOA: 02/22/2024 PCP: [name], MD   Brief HPI:   68 y.o. M with cerebral palsy presented from PCP's office for suspected pneumonia.   In the ER, CXR suggested loculated effusion, but CT showed only small effusion with RLL pneumonia.    Assessment/Plan:    * Multifocal pneumonia CT chest shows RUL and RML pneumonia with small pleural effusion. - Continue Rocephin and azithromycin - RT consult for chest vest - Consult SLP given ?aspiration; started on dysphagia 1 diet with nectar thick liquid - Antitussives for cough   Medications     azithromycin  250 mg Oral Daily   dextromethorphan-guaiFENesin  1 tablet Oral BID   enoxaparin (LOVENOX) injection  40 mg Subcutaneous Q24H   lidocaine  1 patch Transdermal Q24H   polyethylene glycol  17 g Oral Daily     Data Reviewed:   CBG:  No results for input(s): "GLUCAP" in the last 168 hours.  SpO2: 96 %    Vitals:   02/23/24 1250 02/23/24 1609 02/23/24 2035 02/24/24 0428  BP: (!) 121/104 (!) 132/58 100/71 115/69  Pulse: 93  99 86  Resp: 19  20 17   Temp: 98.2 F (36.8 C)  98.4 F (36.9 C) 98.3 F (36.8 C)  TempSrc: Oral  Oral   SpO2: 97%  94% 96%  Weight:      Height:          Data Reviewed:  Basic Metabolic Panel: Recent Labs  Lab 02/22/24 1345 02/23/24 0414 02/24/24 0445  NA 136 139 140  K 3.9 4.0 3.9  CL 102 105 104  CO2 23 24 26   GLUCOSE 102* 93 93  BUN 16 15 15   CREATININE 1.02 1.05 0.94  CALCIUM 8.8* 8.6* 8.8*    CBC: Recent Labs  Lab 02/22/24 1345 02/23/24 0414 02/24/24 0445  WBC 8.2 9.0 8.3  NEUTROABS 5.5  --   --   HGB 12.3* 11.7* 12.0*  HCT 38.1* 36.6* 38.5*  MCV 92.5 94.1 95.3  PLT 397 375 395    LFT Recent Labs  Lab 02/22/24 1345  AST 35  ALT 43  ALKPHOS 39  BILITOT 0.9  PROT 7.4  ALBUMIN 3.0*     Antibiotics: Anti-infectives (From admission, onward)    Start     Dose/Rate Route  Frequency Ordered Stop   02/23/24 1300  azithromycin (ZITHROMAX) 250 mg in dextrose 5 % 125 mL IVPB  Status:  Discontinued        250 mg 127.5 mL/hr over 60 Minutes Intravenous Every 24 hours 02/22/24 1948 02/23/24 0742   02/23/24 1300  cefTRIAXone (ROCEPHIN) 1 g in sodium chloride 0.9 % 100 mL IVPB        1 g 200 mL/hr over 30 Minutes Intravenous Every 24 hours 02/22/24 1948     02/23/24 1200  azithromycin (ZITHROMAX) tablet 250 mg        250 mg Oral Daily 02/23/24 0742 02/27/24 0959   02/22/24 1745  cefTRIAXone (ROCEPHIN) 1 g in sodium chloride 0.9 % 100 mL IVPB        1 g 200 mL/hr over 30 Minutes Intravenous  Once 02/22/24 1738 02/22/24 1853   02/22/24 1745  azithromycin (ZITHROMAX) tablet 500 mg        500 mg Oral  Once 02/22/24 1738 02/22/24 1751        DVT prophylaxis: Lovenox  Code Status: Full code  Family Communication:  CONSULTS    Subjective   Still complains of coughing.  Not requiring oxygen   Objective    Physical Examination:   General-appears in no acute distress Heart-S1-S2, regular, no murmur auscultated Lungs-clear to auscultation bilaterally, no wheezing or crackles auscultated Abdomen-soft, nontender, no organomegaly Extremities-no edema in the lower extremities Neuro-alert, oriented x3, no focal deficit noted  Status is: Inpatient:             Meredeth Ide   [facility] If 7PM-7AM, please contact night-coverage at [email or web address], Office  [phone]   02/24/2024, 10:37 AM  LOS: 1 day

## 2024-02-24 NOTE — Plan of Care (Signed)

## 2024-02-24 NOTE — Progress Notes (Signed)
 Speech Language Pathology Treatment: Dysphagia  Patient Details Name: Peter Levy MRN: [id number] DOB: 1956-10-11 Today's Date: 02/24/2024 Time: 4166-0630 SLP Time Calculation (min) (ACUTE ONLY): 19 min  Assessment / Plan / Recommendation Clinical Impression  Ongoing diagnostic dysphagia therapy provided today; Pt presents with a dry almost constant cough noted as SLP enters room. Pt briefly stopped coughing and reports being thirsty. Pt consumed ~150ccs of thin liquids via straw without overt or immediate coughing however after ~84min delay note minimally wet prolonged coughing that was very similar to the coughing noted at baseline prior to PO trials. Pt accepted puree trials and note intermittent coughing. Challenging to differentiate coughing associated with swallowing (does not seem to be associated) or just consistent cough that was noted at baseline. Recommend proceed with instrumental assessment to determine appropriate diet and to rule out aspiration. Plan to move forward with MBSS later today if radiology can accommodate. Thank you,     HPI HPI: Peter Levy is a 68 y.o. male with a PMH significant for CP.  At baseline, they live at a group home and are ambulatory with a walker and somewhat independent with ADLs. They presented from group home with a caregiver to the ED on 02/22/2024 with cough x 7 days. Has been treated with tessalon pearles and azithromycin last Monday without improvement. Cough is significantly worse today.   Denies fever, abdominal pain, diarrhea, vomiting. He denies rhinorrhea.  He is unable to provide further history. His caregiver at bedside states that he does not take any OTC treatments for his cough since it is not allowed at the group home. Denies any dysphagia or choking. Cough production is clear sputum. She states that he seems to have congestion but has not had anything come up with coughing or nasal drainage. BSE requested.      SLP Plan  MBS;Continue with  current plan of care      Recommendations for follow up therapy are one component of a multi-disciplinary discharge planning process, led by the attending physician.  Recommendations may be updated based on patient status, additional functional criteria and insurance authorization.    Recommendations  Diet recommendations: Dysphagia 1 (puree);Nectar-thick liquid Liquids provided via: Cup;Straw Medication Administration: Whole meds with liquid Supervision: Patient able to self feed;Intermittent supervision to cue for compensatory strategies Compensations: Slow rate;Small sips/bites Postural Changes and/or Swallow Maneuvers: Seated upright 90 degrees                  Oral care BID;Staff/trained caregiver to provide oral care     Dysphagia, unspecified (R13.10)     MBS;Continue with current plan of care    Madora Barletta H. Romie Levee, CCC-SLP Speech Language Pathologist  Georgetta Haber  02/24/2024, 1:50 PM

## 2024-02-24 NOTE — Progress Notes (Signed)
 Patient's IV came out on accident. MD [place] aware. Patient is only getting iv antibiotics every 24 hours. MD Sharl Ma states that patient will be placed on oral.

## 2024-02-25 DIAGNOSIS — J189 Pneumonia, unspecified organism: Secondary | ICD-10-CM | POA: Diagnosis not present

## 2024-02-25 DIAGNOSIS — G809 Cerebral palsy, unspecified: Secondary | ICD-10-CM | POA: Diagnosis not present

## 2024-02-25 MED ORDER — BUSPIRONE HCL 5 MG PO TABS
5.0000 mg | ORAL_TABLET | Freq: Two times a day (BID) | ORAL | Status: DC
  Administered 2024-02-25 – 2024-02-26 (×3): 5 mg via ORAL
  Filled 2024-02-25 (×4): qty 1

## 2024-02-25 NOTE — Progress Notes (Signed)
 Mobility Specialist Progress Note:    02/25/24 1355  Mobility  Activity Ambulated with assistance to bathroom  Level of Assistance Minimal assist, patient does 75% or more  Assistive Device Four wheel walker  Distance Ambulated (ft) 15 ft  Range of Motion/Exercises Active;All extremities  Activity Response Tolerated well  Mobility Referral No  Mobility visit 1 Mobility  Mobility Specialist Start Time (ACUTE ONLY) 1315  Mobility Specialist Stop Time (ACUTE ONLY) 1335  Mobility Specialist Time Calculation (min) (ACUTE ONLY) 20 min   Pt received in chair requesting assistance to bathroom. Required MinA to stand and ambulate with RW. Tolerated well, asx throughout. Returned to chair, alarm on. NT and family in room, all needs met.   Lawerance Bach Mobility Specialist Please contact via Special educational needs teacher or  Rehab office at [phone]

## 2024-02-25 NOTE — Plan of Care (Signed)
   Problem: Education: Goal: Knowledge of General Education information will improve Description: Including pain rating scale, medication(s)/side effects and non-pharmacologic comfort measures Outcome: Progressing   Problem: Clinical Measurements: Goal: Ability to maintain clinical measurements within normal limits will improve Outcome: Progressing Goal: Diagnostic test results will improve Outcome: Progressing

## 2024-02-25 NOTE — Progress Notes (Signed)
 [facility]  PROGRESS NOTE  Peter Levy ZOX:[id number] DOB: 1955/11/23 DOA: 02/22/2024 PCP: [name], MD   Brief HPI:   68 y.o. M with cerebral palsy presented from PCP's office for suspected pneumonia.   In the ER, CXR suggested loculated effusion, but CT showed only small effusion with RLL pneumonia.    Assessment/Plan:    * Multifocal pneumonia CT chest shows RUL and RML pneumonia with small pleural effusion. - Continue cefuroxime and azithromycin - RT consult for chest vest - Consult SLP given ?aspiration; started on dysphagia 1 diet with nectar thick liquid - Antitussives for cough   Medications     azithromycin  250 mg Oral Daily   cefUROXime  500 mg Oral BID WC   enoxaparin (LOVENOX) injection  40 mg Subcutaneous Q24H   ipratropium-albuterol  3 mL Nebulization TID   lidocaine  1 patch Transdermal Q24H   polyethylene glycol  17 g Oral Daily     Data Reviewed:   CBG:  No results for input(s): "GLUCAP" in the last 168 hours.  SpO2: 95 %    Vitals:   02/24/24 1509 02/24/24 1927 02/24/24 2040 02/25/24 0429  BP:  123/66  101/65  Pulse:  99  78  Resp:  20  20  Temp:  98.9 F (37.2 C)  99.4 F (37.4 C)  TempSrc:  Oral  Oral  SpO2: 95% 98% 94% 95%  Weight:      Height:          Data Reviewed:  Basic Metabolic Panel: Recent Labs  Lab 02/22/24 1345 02/23/24 0414 02/24/24 0445  NA 136 139 140  K 3.9 4.0 3.9  CL 102 105 104  CO2 23 24 26   GLUCOSE 102* 93 93  BUN 16 15 15   CREATININE 1.02 1.05 0.94  CALCIUM 8.8* 8.6* 8.8*    CBC: Recent Labs  Lab 02/22/24 1345 02/23/24 0414 02/24/24 0445  WBC 8.2 9.0 8.3  NEUTROABS 5.5  --   --   HGB 12.3* 11.7* 12.0*  HCT 38.1* 36.6* 38.5*  MCV 92.5 94.1 95.3  PLT 397 375 395    LFT Recent Labs  Lab 02/22/24 1345  AST 35  ALT 43  ALKPHOS 39  BILITOT 0.9  PROT 7.4  ALBUMIN 3.0*     Antibiotics: Anti-infectives (From admission, onward)    Start     Dose/Rate Route Frequency  Ordered Stop   02/24/24 1700  cefUROXime (CEFTIN) tablet 500 mg        500 mg Oral 2 times daily with meals 02/24/24 1337     02/23/24 1300  azithromycin (ZITHROMAX) 250 mg in dextrose 5 % 125 mL IVPB  Status:  Discontinued        250 mg 127.5 mL/hr over 60 Minutes Intravenous Every 24 hours 02/22/24 1948 02/23/24 0742   02/23/24 1300  cefTRIAXone (ROCEPHIN) 1 g in sodium chloride 0.9 % 100 mL IVPB  Status:  Discontinued        1 g 200 mL/hr over 30 Minutes Intravenous Every 24 hours 02/22/24 1948 02/24/24 1337   02/23/24 1200  azithromycin (ZITHROMAX) tablet 250 mg        250 mg Oral Daily 02/23/24 0742 02/27/24 0959   02/22/24 1745  cefTRIAXone (ROCEPHIN) 1 g in sodium chloride 0.9 % 100 mL IVPB        1 g 200 mL/hr over 30 Minutes Intravenous  Once 02/22/24 1738 02/22/24 1853   02/22/24 1745  azithromycin (ZITHROMAX) tablet 500  mg        500 mg Oral  Once 02/22/24 1738 02/22/24 1751        DVT prophylaxis: Lovenox  Code Status: Full code  Family Communication:    CONSULTS    Subjective    Still complains of cough.  Objective    Physical Examination:   Appears in no acute distress S1-S2, regular Lungs-mild rhonchi bilaterally  Status is: Inpatient:             Meredeth Ide   [facility] If 7PM-7AM, please contact night-coverage at [email or web address], Office  [phone]   02/25/2024, 8:13 AM  LOS: 2 days

## 2024-02-25 NOTE — Progress Notes (Signed)
 Physical Therapy Treatment Patient Details Name: ANDY ALLENDE MRN: [id number] DOB: 1956/08/26 Today's Date: 02/25/2024   History of Present Illness TIMITHY ARONS is a 68 y.o. male with a PMH significant for CP.  At baseline, they live at a group home and are ambulatory with a walker and somewhat independent with ADLs. They presented from group home with a caregiver to the ED on 02/22/2024 with cough x 7 days. Has been treated with tessalon pearles and azithromycin last Monday without improvement. Cough is significantly worse today.   Denies fever, abdominal pain, diarrhea, vomiting. He denies rhinorrhea.  He is unable to provide further history. His caregiver at bedside states that he does not take any OTC treatments for his cough since it is not allowed at the group home. Denies any dysphagia or choking. Cough production is clear sputum. She states that he seems to have congestion but has not had anything come up with coughing or nasal drainage.    PT Comments  Pt sitting in chair upon therapist's entrance and willing to participate.  Pt with min cueing for hand placement for safe transfer to standing.  Used rollator with ability to increased distance with no LOB episodes.  EOS pt left in chair with call bell within reach and chair alarm set.  No reports of pain through session.    If plan is discharge home, recommend the following: A lot of help with walking and/or transfers;A lot of help with bathing/dressing/bathroom;Assist for transportation;Help with stairs or ramp for entrance   Can travel by private vehicle        Equipment Recommendations       Recommendations for Other Services       Precautions / Restrictions Precautions Precautions: Fall Precaution/Restrictions Comments: hx cerebral palsy Restrictions Weight Bearing Restrictions Per Provider Order: No     Mobility  Bed Mobility               General bed mobility comments: sitting in chair upon therapist entrance     Transfers Overall transfer level: Needs assistance Equipment used: Rollator (4 wheels) Transfers: Sit to/from Stand Sit to Stand: Min assist           General transfer comment: Cueing for handplacement, tremmors presents    Ambulation/Gait Ambulation/Gait assistance: Contact guard assist, Min assist Gait Distance (Feet): 100 Feet Assistive device: Rollator (4 wheels) Gait Pattern/deviations: Knee flexed in stance - right, Decreased stance time - right, Trunk flexed, Drifts right/left       General Gait Details: R LE spasticity and decreased ROM; lateral lean to the R   Stairs             Wheelchair Mobility     Tilt Bed    Modified Rankin (Stroke Patients Only)       Balance                                            Communication Communication Communication: No apparent difficulties  Cognition Arousal: Alert Behavior During Therapy: WFL for tasks assessed/performed   PT - Cognitive impairments: Difficult to assess                       PT - Cognition Comments: A & O x 3 Following commands: Intact      Cueing    Exercises  General Comments        Pertinent Vitals/Pain Pain Assessment Pain Assessment: No/denies pain    Home Living                          Prior Function            PT Goals (current goals can now be found in the care plan section)      Frequency           PT Plan      Co-evaluation              AM-PAC PT "6 Clicks" Mobility   Outcome Measure  Help needed turning from your back to your side while in a flat bed without using bedrails?: A Little Help needed moving from lying on your back to sitting on the side of a flat bed without using bedrails?: A Little Help needed moving to and from a bed to a chair (including a wheelchair)?: A Little Help needed standing up from a chair using your arms (e.g., wheelchair or bedside chair)?: A Little Help needed to  walk in hospital room?: A Little Help needed climbing 3-5 steps with a railing? : A Lot 6 Click Score: 17    End of Session Equipment Utilized During Treatment: Gait belt Activity Tolerance: Patient tolerated treatment well Patient left: in chair;with chair alarm set Nurse Communication: Mobility status PT Visit Diagnosis: Unsteadiness on feet (R26.81)     Time: 4098-1191 PT Time Calculation (min) (ACUTE ONLY): 12 min  Charges:    $Therapeutic Exercise: 8-22 mins PT General Charges $$ ACUTE PT VISIT: 1 Visit                     Becky Sax, LPTA/CLT; CBIS [phone]  Juel Burrow 02/25/2024, 10:05 AM

## 2024-02-25 NOTE — Progress Notes (Signed)
 Speech Language Pathology Treatment: Dysphagia  Patient Details Name: Peter Levy MRN: [id number] DOB: 05-30-1956 Today's Date: 02/25/2024 Time: 4098-1191 SLP Time Calculation (min) (ACUTE ONLY): 22 min  Assessment / Plan / Recommendation Clinical Impression  Pt seen in room for ongoing dysphagia intervention following MBSS completed yesterday with recommendation for D1/puree and NTL via cup/straw. Pt received in room, sitting up in chair with caregiver present. He had just finished a breathing treatment was coughing periodically upon my arrival. Coughing dissipated toward the end of the visit. Pt consumed mech soft textures (peaches, applesauce) and NTL via cup sips. Pt with occasional delayed, strong coughing, however this occurred separately from PO intake as well. SLP provided information to caregiver about where thickening agents can be purchased (any pharmacy, [employer], [employer]) and our outpatient SLP contact information if Pt does not receive home health SLP services. Pt should be seen for f/u SLP services for repeat MBSS in 3+ weeks or as clinically appropriate. He has never been on thickened liquids before this admission and caregivers report never coughing with PO intake and he is very active. Pt reminded to chew solids well and also shown pursed lip breathing strategy to try after periods of excessive coughing. Recommend advance to D2 and continue NTL via cup/straw and PO medications whole with thickened liquids or puree. Pt's PCP is Dr. Phillips Odor and he can always refer Pt for outpatient MBSS in ~3weeks.    HPI HPI: Peter Levy is a 68 y.o. male with a PMH significant for CP.  At baseline, they live at a group home and are ambulatory with a walker and somewhat independent with ADLs. They presented from group home with a caregiver to the ED on 02/22/2024 with cough x 7 days. Has been treated with tessalon pearles and azithromycin last Monday without improvement. Cough is significantly worse  today.   Denies fever, abdominal pain, diarrhea, vomiting. He denies rhinorrhea.  He is unable to provide further history. His caregiver at bedside states that he does not take any OTC treatments for his cough since it is not allowed at the group home. Denies any dysphagia or choking. Cough production is clear sputum. She states that he seems to have congestion but has not had anything come up with coughing or nasal drainage. BSE requested.      SLP Plan  Continue with current plan of care      Recommendations for follow up therapy are one component of a multi-disciplinary discharge planning process, led by the attending physician.  Recommendations may be updated based on patient status, additional functional criteria and insurance authorization.    Recommendations  Diet recommendations: Dysphagia 2 (fine chop);Nectar-thick liquid Liquids provided via: Cup;Straw Medication Administration: Whole meds with liquid Supervision: Patient able to self feed;Intermittent supervision to cue for compensatory strategies Compensations: Slow rate;Small sips/bites Postural Changes and/or Swallow Maneuvers: Seated upright 90 degrees                  Oral care BID   Set up Supervision/Assistance Dysphagia, oropharyngeal phase (R13.12)     Continue with current plan of care    Thank you,  Havery Moros, CCC-SLP [phone]  Calleigh Lafontant  02/25/2024, 4:07 PM

## 2024-02-26 DIAGNOSIS — D649 Anemia, unspecified: Secondary | ICD-10-CM

## 2024-02-26 DIAGNOSIS — J189 Pneumonia, unspecified organism: Secondary | ICD-10-CM | POA: Diagnosis not present

## 2024-02-26 DIAGNOSIS — G809 Cerebral palsy, unspecified: Secondary | ICD-10-CM | POA: Diagnosis not present

## 2024-02-26 MED ORDER — CEFUROXIME AXETIL 500 MG PO TABS: 500.0000 mg | ORAL_TABLET | Freq: Two times a day (BID) | ORAL | 0 refills | Status: AC

## 2024-02-26 MED ORDER — GUAIFENESIN ER 600 MG PO TB12: 600.0000 mg | ORAL_TABLET | Freq: Two times a day (BID) | ORAL | 0 refills | Status: AC

## 2024-02-26 NOTE — NC FL2 (Signed)
 [place] MEDICAID FL2 LEVEL OF CARE FORM     IDENTIFICATION  Patient Name: Peter Levy Birthdate: [birth year] Sex: male Admission Date (Current Location): 02/22/2024  [facility] and [place] Number:  [employer] and Address:  [facility],  [address], [place] [address]      Provider Number: [id number]  Attending Physician Name and Address:  Meredeth Ide, MD  Relative Name and Phone Number:       Current Level of Care: Hospital Recommended Level of Care: Other (Comment) (group home) Prior Approval Number:    Date Approved/Denied:   PASRR Number:    Discharge Plan: Other (Comment) (Group Home)    Current Diagnoses: Patient Active Problem List   Diagnosis Date Noted   Normocytic anemia 02/23/2024   Multifocal pneumonia 02/22/2024   Rhabdomyolysis 08/27/2021   Cerebral palsy (HCC) 08/27/2021   Special screening for malignant neoplasms, colon    Failed attempted surgical procedure     Orientation RESPIRATION BLADDER Height & Weight     Self, Time, Situation, Place  Normal Continent Weight: 157 lb 6.5 oz (71.4 kg) Height:  5\' 5"  (165.1 cm)  BEHAVIORAL SYMPTOMS/MOOD NEUROLOGICAL BOWEL NUTRITION STATUS      Continent Diet (regular)  AMBULATORY STATUS COMMUNICATION OF NEEDS Skin   Independent (with walker) Verbally Normal                       Personal Care Assistance Level of Assistance  Bathing, Feeding, Dressing Bathing Assistance: Limited assistance Feeding assistance: Independent Dressing Assistance: Limited assistance     Functional Limitations Info  Sight, Hearing, Speech Sight Info: Adequate Hearing Info: Adequate Speech Info: Impaired    SPECIAL CARE FACTORS FREQUENCY                       Contractures Contractures Info: Not present    Additional Factors Info  Code Status, Allergies Code Status Info: Full Allergies Info: NKA           Current Medications (02/26/2024):  This is the current hospital  active medication list Current Facility-Administered Medications  Medication Dose Route Frequency Provider Last Rate Last Admin   acetaminophen (TYLENOL) tablet 650 mg  650 mg Oral Q6H PRN Leeroy Bock, MD       Or   acetaminophen (TYLENOL) suppository 650 mg  650 mg Rectal Q6H PRN Leeroy Bock, MD       albuterol (VENTOLIN HFA) 108 (90 Base) MCG/ACT inhaler 2 puff  2 puff Inhalation Q2H PRN Leeroy Bock, MD   2 puff at 02/22/24 1751   benzonatate (TESSALON) capsule 200 mg  200 mg Oral TID PRN Alberteen Sam, MD   200 mg at 02/25/24 2110   bisacodyl (DULCOLAX) EC tablet 5 mg  5 mg Oral Daily PRN Leeroy Bock, MD       busPIRone (BUSPAR) tablet 5 mg  5 mg Oral BID Meredeth Ide, MD   5 mg at 02/26/24 0840   cefUROXime (CEFTIN) tablet 500 mg  500 mg Oral BID WC Meredeth Ide, MD   500 mg at 02/26/24 0840   chlorpheniramine-HYDROcodone (TUSSIONEX) 10-8 MG/5ML suspension 5 mL  5 mL Oral Q12H PRN Adefeso, Oladapo, DO   5 mL at 02/25/24 1708   enoxaparin (LOVENOX) injection 40 mg  40 mg Subcutaneous Q24H Leeroy Bock, MD   40 mg at 02/25/24 2109   ipratropium-albuterol (DUONEB) 0.5-2.5 (3) MG/3ML  nebulizer solution 3 mL  3 mL Nebulization TID Meredeth Ide, MD   3 mL at 02/26/24 0817   lidocaine (LIDODERM) 5 % 1 patch  1 patch Transdermal Q24H Adefeso, Oladapo, DO   1 patch at 02/25/24 2350   polyethylene glycol (MIRALAX / GLYCOLAX) packet 17 g  17 g Oral Daily Leeroy Bock, MD   17 g at 02/26/24 9629     Discharge Medications:  STOP taking these medications     azithromycin 250 MG tablet Commonly known as: ZITHROMAX    ibuprofen 800 MG tablet Commonly known as: ADVIL           TAKE these medications     albuterol 108 (90 Base) MCG/ACT inhaler Commonly known as: VENTOLIN HFA Inhale 2 puffs into the lungs every 4 (four) hours as needed for wheezing or shortness of breath.    benzonatate 200 MG capsule Commonly known as: TESSALON Take  1 capsule (200 mg total) by mouth 2 (two) times daily as needed for cough.    Breztri Aerosphere 160-9-4.8 MCG/ACT Aero inhaler Generic drug: budeson-glycopyrrolate-formoterol Inhale 2 puffs into the lungs in the morning and at bedtime.    busPIRone 5 MG tablet Commonly known as: BUSPAR Take 1 tablet (5 mg total) by mouth 2 (two) times daily.    cefUROXime 500 MG tablet Commonly known as: CEFTIN Take 1 tablet (500 mg total) by mouth 2 (two) times daily with a meal for 2 days.    cyclobenzaprine 10 MG tablet Commonly known as: FLEXERIL Take 10 mg by mouth 3 (three) times daily.    GAS RELIEF 125 MG Caps Generic drug: Simethicone Take by mouth.    GoodSense ClearLax 17 GM/SCOOP powder Generic drug: polyethylene glycol powder Take 17 g by mouth daily.    polyethylene glycol 17 g packet Commonly known as: MIRALAX / GLYCOLAX Take 1 packet by mouth daily.    guaiFENesin 600 MG 12 hr tablet Commonly known as: Mucinex Take 1 tablet (600 mg total) by mouth 2 (two) times daily for 5 days.    VITAMIN D2 PO Take 1 tablet by mouth daily.    Vitamin D (Ergocalciferol) 50000 units Caps Take 1 capsule by mouth once a week.    Vitamin D3 50 MCG (2000 UT) capsule Take 2,000 Units by mouth daily.           Relevant Imaging Results:  Relevant Lab Results:   Additional Information HH PT/OT  Elliot Gault, LCSW

## 2024-02-26 NOTE — Plan of Care (Signed)
  Problem: Education: Goal: Knowledge of General Education information will improve Description: Including pain rating scale, medication(s)/side effects and non-pharmacologic comfort measures Outcome: Adequate for Discharge   Problem: Clinical Measurements: Goal: Ability to maintain clinical measurements within normal limits will improve Outcome: Adequate for Discharge Goal: Will remain free from infection Outcome: Adequate for Discharge Goal: Diagnostic test results will improve Outcome: Adequate for Discharge

## 2024-02-26 NOTE — Progress Notes (Signed)
 Speech Language Pathology Treatment: Dysphagia  Patient Details Name: Peter Levy MRN: [id number] DOB: 1956-10-17 Today's Date: 02/26/2024 Time: 9562-1308 SLP Time Calculation (min) (ACUTE ONLY): 19 min  Assessment / Plan / Recommendation Clinical Impression  Ongoing diagnostic dysphagia therapy provided today. Pt consumed NTL via straw without overt s/sx of oropharyngeal dysphagia. RN reports excessive coughing with D2 breakfast tray (primarily D2 sausage) tray was eventually removed. Meat was not moistened with gravy and suspect dry small pieces exacerbate sensitive cough reflex and Pt struggles to recover and stop coughing after starting to cough. Recommend downgrade Pt's diet back to D1/puree while in hospital setting,however, at home recommend D2/minced MOIST textures are ok if Pt tolerates without coughing. Reviewed recommendation with RN and Patient. Attempted to contact family via phone to review recommendation for D1 in the hospital but D2 in home setting however unable to reach either contact in chart via phone this am. ST will continue to follow acutely.     HPI HPI: Peter Levy is a 68 y.o. male with a PMH significant for CP.  At baseline, they live at a group home and are ambulatory with a walker and somewhat independent with ADLs. They presented from group home with a caregiver to the ED on 02/22/2024 with cough x 7 days. Has been treated with tessalon pearles and azithromycin last Monday without improvement. Cough is significantly worse today.   Denies fever, abdominal pain, diarrhea, vomiting. He denies rhinorrhea.  He is unable to provide further history. His caregiver at bedside states that he does not take any OTC treatments for his cough since it is not allowed at the group home. Denies any dysphagia or choking. Cough production is clear sputum. She states that he seems to have congestion but has not had anything come up with coughing or nasal drainage. BSE requested.      SLP  Plan  Continue with current plan of care      Recommendations for follow up therapy are one component of a multi-disciplinary discharge planning process, led by the attending physician.  Recommendations may be updated based on patient status, additional functional criteria and insurance authorization.    Recommendations  Diet recommendations: Dysphagia 1 (puree);Thin liquid Liquids provided via: Cup;Straw Medication Administration: Whole meds with liquid Supervision: Patient able to self feed;Intermittent supervision to cue for compensatory strategies Compensations: Slow rate;Small sips/bites Postural Changes and/or Swallow Maneuvers: Seated upright 90 degrees                  Oral care BID   Set up Supervision/Assistance Dysphagia, oropharyngeal phase (R13.12)     Continue with current plan of care    Gracieann Stannard H. Romie Levee, CCC-SLP Speech Language Pathologist  Georgetta Haber  02/26/2024, 10:24 AM

## 2024-02-26 NOTE — Discharge Summary (Addendum)
 Physician Discharge Summary   Patient: Peter Levy MRN: [id number] DOB: 1956-02-26  Admit date:     02/22/2024  Discharge date: 02/26/24  Discharge Physician: Meredeth Ide   PCP: [name], MD   Recommendations at discharge:   Started on dysphagia 1 diet with thin liquid Take cefuroxime 500 mg p.o. twice daily for 2 more days Take Mucinex 600 mg p.o. twice daily for 5 days Need repeat CT chest in 4 weeks to check for resolution of pneumonia as outpatient  Discharge Diagnoses: Principal Problem:   Multifocal pneumonia Active Problems:   Cerebral palsy (HCC)   Normocytic anemia  Resolved Problems:   * No resolved hospital problems. *  Hospital Course: 68 y.o. M with cerebral palsy presented from PCP's office for suspected pneumonia.  In the ER, CXR suggested loculated effusion, but CT showed only small effusion with RLL pneumonia.  Admitted on antibiotics.  Assessment and Plan: Multifocal pneumonia CT chest shows right upper lobe and right middle lobe pneumonia with small pleural effusion -Significantly improved with antibiotics.  Given for 4 days. - Will discharge on cefuroxime 500 mg p.o. twice daily for 2 more days - Continue Mucinex 600 mg p.o. twice daily for 5 days - Continue Tessalon Perles for cough  He will need repeat CT chest in 4 weeks to check for resolution of pneumonia as outpatient.  Dysphagia Swallow evaluation obtained Patient started on dysphagia 1 diet with thin liquid      Consultants:  Procedures performed:  Disposition: Group home Diet recommendation:  Discharge Diet Orders (From admission, onward)     Start     Ordered   02/26/24 0000  Diet - low sodium heart healthy       Comments: Dysphagia 1 (puree);Thin liquid   02/26/24 1036           Dysphagia type 1 thin Liquid DISCHARGE MEDICATION: Allergies as of 02/26/2024   No Known Allergies      Medication List     STOP taking these medications    azithromycin 250 MG  tablet Commonly known as: ZITHROMAX   ibuprofen 800 MG tablet Commonly known as: ADVIL       TAKE these medications    albuterol 108 (90 Base) MCG/ACT inhaler Commonly known as: VENTOLIN HFA Inhale 2 puffs into the lungs every 4 (four) hours as needed for wheezing or shortness of breath.   benzonatate 200 MG capsule Commonly known as: TESSALON Take 1 capsule (200 mg total) by mouth 2 (two) times daily as needed for cough.   Breztri Aerosphere 160-9-4.8 MCG/ACT Aero inhaler Generic drug: budeson-glycopyrrolate-formoterol Inhale 2 puffs into the lungs in the morning and at bedtime.   busPIRone 5 MG tablet Commonly known as: BUSPAR Take 1 tablet (5 mg total) by mouth 2 (two) times daily.   cefUROXime 500 MG tablet Commonly known as: CEFTIN Take 1 tablet (500 mg total) by mouth 2 (two) times daily with a meal for 2 days.   cyclobenzaprine 10 MG tablet Commonly known as: FLEXERIL Take 10 mg by mouth 3 (three) times daily.   GAS RELIEF 125 MG Caps Generic drug: Simethicone Take by mouth.   GoodSense ClearLax 17 GM/SCOOP powder Generic drug: polyethylene glycol powder Take 17 g by mouth daily.   polyethylene glycol 17 g packet Commonly known as: MIRALAX / GLYCOLAX Take 1 packet by mouth daily.   guaiFENesin 600 MG 12 hr tablet Commonly known as: Mucinex Take 1 tablet (600 mg total) by mouth 2 (  two) times daily for 5 days.   VITAMIN D2 PO Take 1 tablet by mouth daily.   Vitamin D (Ergocalciferol) 50000 units Caps Take 1 capsule by mouth once a week.   Vitamin D3 50 MCG (2000 UT) capsule Take 2,000 Units by mouth daily.        Discharge Exam: Filed Weights   02/22/24 1311 02/22/24 2259  Weight: 83 kg 71.4 kg   Appears in acute distress Mild rhonchi bilaterally S1-S2, regular  Condition at discharge: good  The results of significant diagnostics from this hospitalization (including imaging, microbiology, ancillary and laboratory) are listed below for  reference.   Imaging Studies: DG Swallowing Func-Speech Pathology Result Date: 02/24/2024 Table formatting from the original result was not included. Modified Barium Swallow Study Patient Details Name: Peter Levy MRN: [id number] Date of Birth: February 13, 1956 Today's Date: 02/24/2024 HPI/PMH: HPI: Peter Levy is a 68 y.o. male with a PMH significant for CP.  At baseline, they live at a group home and are ambulatory with a walker and somewhat independent with ADLs. They presented from group home with a caregiver to the ED on 02/22/2024 with cough x 7 days. Has been treated with tessalon pearles and azithromycin last Monday without improvement. Cough is significantly worse today.   Denies fever, abdominal pain, diarrhea, vomiting. He denies rhinorrhea.  He is unable to provide further history. His caregiver at bedside states that he does not take any OTC treatments for his cough since it is not allowed at the group home. Denies any dysphagia or choking. Cough production is clear sputum. She states that he seems to have congestion but has not had anything come up with coughing or nasal drainage. BSE complete, recommending MBSS Clinical Impression: Pt presents with mild/moderate oropharyngeal dysphagia characterized by occasional aspiration of thin liquids that results in delayed coughing episodes that are ineffective in clearing aspirates. Pt demonstrates some behavioral differences when swallowing: throwing his head back when thin liquids are in his mouth and jerky movements prior to triggering a swallow. Aspiration (min to moderate amounts) noted with large bolus presentations of thin when liquids fill the pharynx and fall to the level of the pyriforms before a delayed swallow is triggered; aspiration occured during the swallow. With small cup sips of thin only occasional trace aspiration was noted. Suspect jerking movements/behavioral differences with swallowing and current decompensated status increase risk of  aspiration. No aspiration was noted with other trials (NTL, HTL, puree, barium tablet or regular). Pt demonstrated some jerking and movement with other PO trials but most severely noted with thin liquids which again seemed to negatively impact airway protection. With solid textures note decreased coordination and nursing and family report prolonged coughing with solid trials. Recommend D1/puree and NECTAR thick liquids. Recommend meds be administered whole with NTL - straw with NTL is ok as Pt has difficulty coordinating cup sips. ST will continue to follow acutely and recommend ST f/u at d/c location and consider repeat instrumental assessment when clinically indicated. Thank you, Factors that may increase risk of adverse event in presence of aspiration Rubye Oaks [employer] Clearance Coots 2021): No data recorded Recommendations/Plan: Swallowing Evaluation Recommendations Swallowing Evaluation Recommendations Recommendations: PO diet PO Diet Recommendation: Dysphagia 1 (Pureed); Thin liquids (Level 0) Liquid Administration via: Cup; Straw (straw with NECTAR) Medication Administration: Crushed with puree Supervision: Patient able to self-feed; Staff to assist with self-feeding Swallowing strategies  : Small bites/sips Postural changes: Position pt fully upright for meals Oral care recommendations: Oral care BID (2x/day) Caregiver  Recommendations: Remove water pitcher; Avoid jello, ice cream, thin soups, popsicles Treatment Plan Treatment Plan Treatment recommendations: Therapy as outlined in treatment plan below Follow-up recommendations: No SLP follow up Treatment frequency: Min 2x/week Treatment duration: 1 week Interventions: Aspiration precaution training; Patient/family education; Trials of upgraded texture/liquids; Diet toleration management by SLP; Compensatory techniques; Oropharyngeal exercises Recommendations Recommendations for follow up therapy are one component of a multi-disciplinary discharge planning process, led by  the attending physician.  Recommendations may be updated based on patient status, additional functional criteria and insurance authorization. Assessment: Orofacial Exam: Orofacial Exam Oral Cavity: Oral Hygiene: WFL Oral Cavity - Dentition: Missing dentition; Adequate natural dentition Anatomy: Anatomy: WFL Boluses Administered: Boluses Administered Boluses Administered: Thin liquids (Level 0); Mildly thick liquids (Level 2, nectar thick); Moderately thick liquids (Level 3, honey thick); Puree; Solid  Oral Impairment Domain: Oral Impairment Domain Lip Closure: No labial escape; Interlabial escape, no progression to anterior lip Tongue control during bolus hold: Cohesive bolus between tongue to palatal seal Bolus preparation/mastication: Timely and efficient chewing and mashing; Slow prolonged chewing/mashing with complete recollection Bolus transport/lingual motion: Brisk tongue motion Oral residue: Trace residue lining oral structures Location of oral residue : Tongue Initiation of pharyngeal swallow : Pyriform sinuses; Valleculae  Pharyngeal Impairment Domain: Pharyngeal Impairment Domain Soft palate elevation: No bolus between soft palate (SP)/pharyngeal wall (PW) Laryngeal elevation: Partial superior movement of thyroid cartilage/partial approximation of arytenoids to epiglottic petiole Anterior hyoid excursion: Partial anterior movement Epiglottic movement: Partial inversion Laryngeal vestibule closure: Incomplete, narrow column air/contrast in laryngeal vestibule Pharyngeal stripping wave : Present - diminished Pharyngeal contraction (A/P view only): N/A Tongue base retraction: Trace column of contrast or air between tongue base and PPW Pharyngeal residue: Trace residue within or on pharyngeal structures Location of pharyngeal residue: Valleculae; Pharyngeal wall  Esophageal Impairment Domain: No data recorded Pill: Pill Consistency administered: Thin liquids (Level 0); Mildly thick liquids (Level 2, nectar  thick); Moderately thick liquids (Level 3, honey thick); Puree Penetration/Aspiration Scale Score: Penetration/Aspiration Scale Score 1.  Material does not enter airway: Mildly thick liquids (Level 2, nectar thick); Puree; Moderately thick liquids (Level 3, honey thick); Solid; Pill 7.  Material enters airway, passes BELOW cords and not ejected out despite cough attempt by patient: Thin liquids (Level 0) 8.  Material enters airway, passes BELOW cords without attempt by patient to eject out (silent aspiration) : Thin liquids (Level 0) Compensatory Strategies: Compensatory Strategies Compensatory strategies: Yes Chin tuck: Ineffective   General Information: No data recorded Diet Prior to this Study: Dysphagia 1 (pureed); Mildly thick liquids (Level 2, nectar thick)   Temperature : Normal   Respiratory Status: WFL   Supplemental O2: None (Room air)   History of Recent Intubation: No  Behavior/Cognition: Alert; Cooperative; Pleasant mood Self-Feeding Abilities: Able to self-feed; Needs assist with self-feeding Baseline vocal quality/speech: Normal Volitional Cough: Able to elicit Volitional Swallow: Able to elicit No data recorded Goal Planning: Prognosis for improved oropharyngeal function: Good No data recorded No data recorded Patient/Family Stated Goal: n/a No data recorded Pain: Pain Assessment Pain Assessment: No/denies pain End of Session: Start Time:SLP Start Time (ACUTE ONLY): 1555 Stop Time: SLP Stop Time (ACUTE ONLY): 1634 Time Calculation:SLP Time Calculation (min) (ACUTE ONLY): 39 min Charges: SLP Evaluations $ SLP Speech Visit: 1 Visit SLP Evaluations $BSS Swallow: 1 Procedure $MBS Swallow: 1 Procedure $Swallowing Treatment: 1 Procedure SLP visit diagnosis: SLP Visit Diagnosis: Dysphagia, unspecified (R13.10) Past Medical History: Past Medical History: Diagnosis Date  CP (cerebral palsy) (HCC)  Past Surgical History: Past Surgical History: Procedure Laterality Date  COLONOSCOPY    FLEXIBLE SIGMOIDOSCOPY  N/A 05/14/2018  Procedure: FLEXIBLE SIGMOIDOSCOPY;  Surgeon: [name], MD;  Location: [facility];  Service: Gastroenterology;  Laterality: N/A;  none    none Amelia H. Romie Levee, CCC-SLP Speech Language Pathologist Georgetta Haber 02/24/2024, 4:58 PM  CT Chest W Contrast Result Date: 02/22/2024 CLINICAL DATA:  Chronic dyspnea. EXAM: CT CHEST WITH CONTRAST TECHNIQUE: Multidetector CT imaging of the chest was performed during intravenous contrast administration. RADIATION DOSE REDUCTION: This exam was performed according to the departmental dose-optimization program which includes automated exposure control, adjustment of the mA and/or kV according to patient size and/or use of iterative reconstruction technique. CONTRAST:  75mL OMNIPAQUE IOHEXOL 300 MG/ML  SOLN COMPARISON:  Chest radiograph of earlier today. FINDINGS: Cardiovascular: Moderate motion degradation throughout. Normal aortic caliber. Normal heart size, without pericardial effusion. Mediastinum/Nodes: No mediastinal or hilar adenopathy. Lungs/Pleura: Small right pleural effusion. Anterior inferior right upper lobe and subjacent right middle lobe probable consolidation. The anterior inferior component is somewhat more masslike, without traversing air bronchograms on 78/2. Superior anterior right middle lobe 4 mm pulmonary nodule on 84/3. Dependent right lower lobe airspace disease. Upper Abdomen: Motion degradation continuing into the upper abdomen. No gross acute abnormality identified. Mildly prominent, gas-filled transverse colon. Musculoskeletal: No acute osseous abnormality. IMPRESSION: 1. Moderate motion degradation throughout. 2. Small right pleural effusion with right lower lobe airspace disease, favored to represent pneumonia. A component of this could represent compressive atelectasis. 3. Anterior inferior right upper and superior right middle lobe areas of consolidation, also likely pneumonia. The anterior inferior right upper lobe  component is somewhat more nodular and neoplasm is felt less likely. Consider appropriate antibiotic therapy and chest CT follow-up at 4-6 weeks. 4. 4 mm right middle lobe pulmonary nodule can be presumed benign and do/does not warrant imaging follow-up per Fleischner criteria. Electronically Signed   By: Jeronimo Greaves M.D.   On: 02/22/2024 16:54   DG Chest 2 View Result Date: 02/22/2024 CLINICAL DATA:  Cough and shortness of breath. EXAM: CHEST - 2 VIEW COMPARISON:  Chest radiograph dated 12/30/2023. FINDINGS: The heart size and mediastinal contours are within normal limits. New small right pleural effusion with suspected loculated component along the lateral chest wall. There is associated right basilar atelectasis. The left lung appears clear. No definite pneumothorax. Dilated air-filled loops of bowel are again noted beneath the left hemidiaphragm. IMPRESSION: 1. New small right pleural effusion with suspected loculation laterally and associated basilar atelectasis. 2. Dilated air-filled loops of bowel are again noted beneath the left hemidiaphragm. Clinical correlation is recommended. Electronically Signed   By: Hart Robinsons M.D.   On: 02/22/2024 15:10    Microbiology: Results for orders placed or performed during the hospital encounter of 02/22/24  Resp panel by RT-PCR (RSV, Flu A&B, Covid) Anterior Nasal Swab     Status: None   Collection Time: 02/22/24  1:11 PM   Specimen: Anterior Nasal Swab  Result Value Ref Range Status   SARS Coronavirus 2 by RT PCR NEGATIVE NEGATIVE Final    Comment: (NOTE) SARS-CoV-2 target nucleic acids are NOT DETECTED.  The SARS-CoV-2 RNA is generally detectable in upper respiratory specimens during the acute phase of infection. The lowest concentration of SARS-CoV-2 viral copies this assay can detect is 138 copies/mL. A negative result does not preclude SARS-Cov-2 infection and should not be used as the sole basis for treatment or other patient management  decisions.  A negative result may occur with  improper specimen collection/handling, submission of specimen other than nasopharyngeal swab, presence of viral mutation(s) within the areas targeted by this assay, and inadequate number of viral copies(<138 copies/mL). A negative result must be combined with clinical observations, patient history, and epidemiological information. The expected result is Negative.  Fact Sheet for Patients:  BloggerCourse.com  Fact Sheet for Healthcare Providers:  SeriousBroker.it  This test is no t yet approved or cleared by the [place] FDA and  has been authorized for detection and/or diagnosis of SARS-CoV-2 by FDA under an Emergency Use Authorization (EUA). This EUA will remain  in effect (meaning this test can be used) for the duration of the COVID-19 declaration under Section 564(b)(1) of the Act, 21 U.S.C.section 360bbb-3(b)(1), unless the authorization is terminated  or revoked sooner.       Influenza A by PCR NEGATIVE NEGATIVE Final   Influenza B by PCR NEGATIVE NEGATIVE Final    Comment: (NOTE) The Xpert Xpress SARS-CoV-2/FLU/RSV plus assay is intended as an aid in the diagnosis of influenza from Nasopharyngeal swab specimens and should not be used as a sole basis for treatment. Nasal washings and aspirates are unacceptable for Xpert Xpress SARS-CoV-2/FLU/RSV testing.  Fact Sheet for Patients: BloggerCourse.com  Fact Sheet for Healthcare Providers: SeriousBroker.it  This test is not yet approved or cleared by the [place] FDA and has been authorized for detection and/or diagnosis of SARS-CoV-2 by FDA under an Emergency Use Authorization (EUA). This EUA will remain in effect (meaning this test can be used) for the duration of the COVID-19 declaration under Section 564(b)(1) of the Act, 21 U.S.C. section 360bbb-3(b)(1), unless the  authorization is terminated or revoked.     Resp Syncytial Virus by PCR NEGATIVE NEGATIVE Final    Comment: (NOTE) Fact Sheet for Patients: BloggerCourse.com  Fact Sheet for Healthcare Providers: SeriousBroker.it  This test is not yet approved or cleared by the [place] FDA and has been authorized for detection and/or diagnosis of SARS-CoV-2 by FDA under an Emergency Use Authorization (EUA). This EUA will remain in effect (meaning this test can be used) for the duration of the COVID-19 declaration under Section 564(b)(1) of the Act, 21 U.S.C. section 360bbb-3(b)(1), unless the authorization is terminated or revoked.  Performed at [facility], [address], [place], [place] [address]   Culture, blood (routine x 2)     Status: None (Preliminary result)   Collection Time: 02/22/24  1:45 PM   Specimen: BLOOD  Result Value Ref Range Status   Specimen Description BLOOD RIGHT ANTECUBITAL  Final   Special Requests   Final    BOTTLES DRAWN AEROBIC AND ANAEROBIC Blood Culture adequate volume   Culture   Final    NO GROWTH 3 DAYS Performed at [facility], [address], [place], [place] [address]    Report Status PENDING  Incomplete  Culture, blood (routine x 2)     Status: None (Preliminary result)   Collection Time: 02/22/24  1:57 PM   Specimen: BLOOD  Result Value Ref Range Status   Specimen Description BLOOD BLOOD RIGHT FOREARM  Final   Special Requests   Final    BOTTLES DRAWN AEROBIC AND ANAEROBIC Blood Culture adequate volume   Culture   Final    NO GROWTH 3 DAYS Performed at [facility], [address], [place], [place] [address]    Report Status PENDING  Incomplete  Respiratory (~20 pathogens) panel by PCR     Status: None  Collection Time: 02/22/24  9:38 PM   Specimen: Nasopharyngeal Swab; Respiratory  Result Value Ref Range Status   Adenovirus NOT DETECTED NOT DETECTED Final   Coronavirus 229E NOT DETECTED  NOT DETECTED Final    Comment: (NOTE) The Coronavirus on the Respiratory Panel, DOES NOT test for the novel  Coronavirus (2019 nCoV)    Coronavirus HKU1 NOT DETECTED NOT DETECTED Final   Coronavirus NL63 NOT DETECTED NOT DETECTED Final   Coronavirus OC43 NOT DETECTED NOT DETECTED Final   Metapneumovirus NOT DETECTED NOT DETECTED Final   Rhinovirus / Enterovirus NOT DETECTED NOT DETECTED Final   Influenza A NOT DETECTED NOT DETECTED Final   Influenza B NOT DETECTED NOT DETECTED Final   Parainfluenza Virus 1 NOT DETECTED NOT DETECTED Final   Parainfluenza Virus 2 NOT DETECTED NOT DETECTED Final   Parainfluenza Virus 3 NOT DETECTED NOT DETECTED Final   Parainfluenza Virus 4 NOT DETECTED NOT DETECTED Final   Respiratory Syncytial Virus NOT DETECTED NOT DETECTED Final   Bordetella pertussis NOT DETECTED NOT DETECTED Final   Bordetella Parapertussis NOT DETECTED NOT DETECTED Final   Chlamydophila pneumoniae NOT DETECTED NOT DETECTED Final   Mycoplasma pneumoniae NOT DETECTED NOT DETECTED Final    Comment: Performed at [facility], [address], [place], [place] [address]    Labs: CBC: Recent Labs  Lab 02/22/24 1345 02/23/24 0414 02/24/24 0445  WBC 8.2 9.0 8.3  NEUTROABS 5.5  --   --   HGB 12.3* 11.7* 12.0*  HCT 38.1* 36.6* 38.5*  MCV 92.5 94.1 95.3  PLT 397 375 395   Basic Metabolic Panel: Recent Labs  Lab 02/22/24 1345 02/23/24 0414 02/24/24 0445  NA 136 139 140  K 3.9 4.0 3.9  CL 102 105 104  CO2 23 24 26   GLUCOSE 102* 93 93  BUN 16 15 15   CREATININE 1.02 1.05 0.94  CALCIUM 8.8* 8.6* 8.8*   Liver Function Tests: Recent Labs  Lab 02/22/24 1345  AST 35  ALT 43  ALKPHOS 39  BILITOT 0.9  PROT 7.4  ALBUMIN 3.0*   CBG: No results for input(s): "GLUCAP" in the last 168 hours.  Discharge time spent: greater than 30 minutes.  Signed: Meredeth Ide, MD [facility] 02/26/2024

## 2024-02-26 NOTE — TOC Transition Note (Signed)
 Transition of Care [facility]) - Discharge Note   Patient Details  Name: Peter Levy MRN: [id number] Date of Birth: Oct 21, 1956  Transition of Care [facility] CM/SW Contact:  Villa Herb, LCSWA Phone Number: 02/26/2024, 11:09 AM  Clinical Narrative:    CSW updated that pt is medically stable for discharge back to facility today. CSW spoke to Ms. Janee Morn with facility who states Destiny with the facility will arrive to pick pt up. Facility requested that Fl2 and D/C summary be provided to driver. CSW printed to nurses station and updated RN requesting she provide it to Destiny. HH PT/OT orders placed and Cory with [facility] accepted referral. TOC signing off.   Final next level of care: Home w Home Health Services Barriers to Discharge: Barriers Resolved   Patient Goals and CMS Choice Patient states their goals for this hospitalization and ongoing recovery are:: return home CMS Medicare.gov Compare Post Acute Care list provided to:: Patient Choice offered to / list presented to : Patient      Discharge Placement                       Discharge Plan and Services Additional resources added to the After Visit Summary for   In-house Referral: Clinical Social Work                        HH Arranged: PT, OT [facility] Agency: [facility] Date [facility] Agency Contacted: 02/26/24   Representative spoke with at [facility] Agency: Kandee Keen  Social Drivers of Health (SDOH) Interventions SDOH Screenings   Food Insecurity: No Food Insecurity (02/22/2024)  Housing: Low Risk  (02/22/2024)  Transportation Needs: No Transportation Needs (02/22/2024)  Utilities: Not At Risk (02/22/2024)  Social Connections: Socially Isolated (02/22/2024)  Tobacco Use: Low Risk  (02/22/2024)     Readmission Risk Interventions     No data to display

## 2024-02-26 NOTE — Discharge Instructions (Signed)
 Will need repeat CT chest in 4 weeks to check resolution of pneumonia

## 2024-02-27 LAB — CULTURE, BLOOD (ROUTINE X 2)
Culture: NO GROWTH
Culture: NO GROWTH
Special Requests: ADEQUATE
Special Requests: ADEQUATE

## 2024-02-29 DIAGNOSIS — D649 Anemia, unspecified: Secondary | ICD-10-CM | POA: Diagnosis not present

## 2024-02-29 DIAGNOSIS — J90 Pleural effusion, not elsewhere classified: Secondary | ICD-10-CM | POA: Diagnosis not present

## 2024-02-29 DIAGNOSIS — R1310 Dysphagia, unspecified: Secondary | ICD-10-CM | POA: Diagnosis not present

## 2024-02-29 DIAGNOSIS — G809 Cerebral palsy, unspecified: Secondary | ICD-10-CM | POA: Diagnosis not present

## 2024-02-29 DIAGNOSIS — R911 Solitary pulmonary nodule: Secondary | ICD-10-CM | POA: Diagnosis not present

## 2024-02-29 DIAGNOSIS — Z9181 History of falling: Secondary | ICD-10-CM | POA: Diagnosis not present

## 2024-02-29 DIAGNOSIS — J9811 Atelectasis: Secondary | ICD-10-CM | POA: Diagnosis not present

## 2024-02-29 DIAGNOSIS — Z7951 Long term (current) use of inhaled steroids: Secondary | ICD-10-CM | POA: Diagnosis not present

## 2024-02-29 DIAGNOSIS — J181 Lobar pneumonia, unspecified organism: Secondary | ICD-10-CM | POA: Diagnosis not present

## 2024-03-01 DIAGNOSIS — D649 Anemia, unspecified: Secondary | ICD-10-CM | POA: Diagnosis not present

## 2024-03-01 DIAGNOSIS — R1310 Dysphagia, unspecified: Secondary | ICD-10-CM | POA: Diagnosis not present

## 2024-03-01 DIAGNOSIS — J9811 Atelectasis: Secondary | ICD-10-CM | POA: Diagnosis not present

## 2024-03-01 DIAGNOSIS — J90 Pleural effusion, not elsewhere classified: Secondary | ICD-10-CM | POA: Diagnosis not present

## 2024-03-01 DIAGNOSIS — G809 Cerebral palsy, unspecified: Secondary | ICD-10-CM | POA: Diagnosis not present

## 2024-03-01 DIAGNOSIS — J181 Lobar pneumonia, unspecified organism: Secondary | ICD-10-CM | POA: Diagnosis not present

## 2024-03-04 ENCOUNTER — Emergency Department (HOSPITAL_COMMUNITY)

## 2024-03-04 ENCOUNTER — Other Ambulatory Visit: Payer: Self-pay

## 2024-03-04 ENCOUNTER — Inpatient Hospital Stay (HOSPITAL_COMMUNITY)
Admission: EM | Admit: 2024-03-04 | Discharge: 2024-03-07 | DRG: 391 | Disposition: A | Discharge status: Home Health | Attending: Internal Medicine | Admitting: Internal Medicine

## 2024-03-04 ENCOUNTER — Encounter (HOSPITAL_COMMUNITY): Payer: Self-pay

## 2024-03-04 DIAGNOSIS — J90 Pleural effusion, not elsewhere classified: Principal | ICD-10-CM | POA: Insufficient documentation

## 2024-03-04 DIAGNOSIS — J189 Pneumonia, unspecified organism: Secondary | ICD-10-CM | POA: Diagnosis present

## 2024-03-04 DIAGNOSIS — G809 Cerebral palsy, unspecified: Secondary | ICD-10-CM | POA: Diagnosis present

## 2024-03-04 DIAGNOSIS — K5981 Ogilvie syndrome: Principal | ICD-10-CM | POA: Diagnosis present

## 2024-03-04 DIAGNOSIS — R0600 Dyspnea, unspecified: Secondary | ICD-10-CM

## 2024-03-04 DIAGNOSIS — J918 Pleural effusion in other conditions classified elsewhere: Secondary | ICD-10-CM | POA: Diagnosis present

## 2024-03-04 DIAGNOSIS — J9811 Atelectasis: Secondary | ICD-10-CM | POA: Diagnosis not present

## 2024-03-04 DIAGNOSIS — R0902 Hypoxemia: Secondary | ICD-10-CM | POA: Diagnosis not present

## 2024-03-04 DIAGNOSIS — Z1152 Encounter for screening for COVID-19: Secondary | ICD-10-CM

## 2024-03-04 DIAGNOSIS — Z7951 Long term (current) use of inhaled steroids: Secondary | ICD-10-CM

## 2024-03-04 DIAGNOSIS — K5939 Other megacolon: Secondary | ICD-10-CM | POA: Diagnosis present

## 2024-03-04 DIAGNOSIS — D649 Anemia, unspecified: Secondary | ICD-10-CM | POA: Diagnosis not present

## 2024-03-04 DIAGNOSIS — Z604 Social exclusion and rejection: Secondary | ICD-10-CM | POA: Diagnosis present

## 2024-03-04 DIAGNOSIS — J181 Lobar pneumonia, unspecified organism: Secondary | ICD-10-CM | POA: Diagnosis not present

## 2024-03-04 DIAGNOSIS — J9601 Acute respiratory failure with hypoxia: Secondary | ICD-10-CM | POA: Diagnosis present

## 2024-03-04 DIAGNOSIS — Z79899 Other long term (current) drug therapy: Secondary | ICD-10-CM | POA: Diagnosis not present

## 2024-03-04 DIAGNOSIS — R1310 Dysphagia, unspecified: Secondary | ICD-10-CM | POA: Diagnosis not present

## 2024-03-04 LAB — BASIC METABOLIC PANEL WITH GFR
Anion gap: 9 (ref 5–15)
BUN: 10 mg/dL (ref 8–23)
CO2: 23 mmol/L (ref 22–32)
Calcium: 9.1 mg/dL (ref 8.9–10.3)
Chloride: 103 mmol/L (ref 98–111)
Creatinine, Ser: 0.91 mg/dL (ref 0.61–1.24)
GFR, Estimated: >60 mL/min (ref >60)
Glucose, Bld: 101 mg/dL — ABNORMAL HIGH (ref 70–99)
Potassium: 4.3 mmol/L (ref 3.5–5.1)
Sodium: 135 mmol/L (ref 135–145)

## 2024-03-04 LAB — RESP PANEL BY RT-PCR (RSV, FLU A&B, COVID)  RVPGX2
Influenza A by PCR: NEGATIVE
Influenza B by PCR: NEGATIVE
Resp Syncytial Virus by PCR: NEGATIVE
SARS Coronavirus 2 by RT PCR: NEGATIVE

## 2024-03-04 LAB — LACTIC ACID, PLASMA
Lactic Acid, Venous: 1.3 mmol/L (ref 0.5–1.9)
Lactic Acid, Venous: 1.4 mmol/L (ref 0.5–1.9)

## 2024-03-04 LAB — CBC WITH DIFFERENTIAL/PLATELET
Abs Immature Granulocytes: 0.01 10*3/uL (ref 0.00–0.07)
Basophils Absolute: 0 10*3/uL (ref 0.0–0.1)
Basophils Relative: 1 %
Eosinophils Absolute: 0.2 10*3/uL (ref 0.0–0.5)
Eosinophils Relative: 3 %
HCT: 37.7 % — ABNORMAL LOW (ref 39.0–52.0)
Hemoglobin: 12 g/dL — ABNORMAL LOW (ref 13.0–17.0)
Immature Granulocytes: 0 %
Lymphocytes Relative: 26 %
Lymphs Abs: 1.6 10*3/uL (ref 0.7–4.0)
MCH: 29.8 pg (ref 26.0–34.0)
MCHC: 31.8 g/dL (ref 30.0–36.0)
MCV: 93.5 fL (ref 80.0–100.0)
Monocytes Absolute: 0.9 10*3/uL (ref 0.1–1.0)
Monocytes Relative: 15 %
Neutro Abs: 3.4 10*3/uL (ref 1.7–7.7)
Neutrophils Relative %: 55 %
Platelets: 451 10*3/uL — ABNORMAL HIGH (ref 150–400)
RBC: 4.03 MIL/uL — ABNORMAL LOW (ref 4.22–5.81)
RDW: 12 % (ref 11.5–15.5)
WBC: 6.1 10*3/uL (ref 4.0–10.5)
nRBC: 0 % (ref 0.0–0.2)

## 2024-03-04 LAB — URINALYSIS, ROUTINE W REFLEX MICROSCOPIC
Bilirubin Urine: NEGATIVE
Glucose, UA: NEGATIVE mg/dL
Hgb urine dipstick: NEGATIVE
Ketones, ur: 20 mg/dL — AB
Leukocytes,Ua: NEGATIVE
Nitrite: NEGATIVE
Protein, ur: NEGATIVE mg/dL
Specific Gravity, Urine: >1.046 — ABNORMAL HIGH (ref 1.005–1.030)
pH: 5 (ref 5.0–8.0)

## 2024-03-04 LAB — BRAIN NATRIURETIC PEPTIDE: B Natriuretic Peptide: 54 pg/mL (ref 0.0–100.0)

## 2024-03-04 LAB — TROPONIN I (HIGH SENSITIVITY)
Troponin I (High Sensitivity): 7 ng/L (ref <18)
Troponin I (High Sensitivity): 7 ng/L (ref <18)

## 2024-03-04 MED ORDER — ENOXAPARIN SODIUM 40 MG/0.4ML IJ SOSY
40.0000 mg | PREFILLED_SYRINGE | INTRAMUSCULAR | Status: DC
  Administered 2024-03-04 – 2024-03-06 (×3): 40 mg via SUBCUTANEOUS
  Filled 2024-03-04 (×3): qty 0.4

## 2024-03-04 MED ORDER — ONDANSETRON HCL 4 MG/2ML IJ SOLN: 4.0000 mg | Freq: Four times a day (QID) | INTRAMUSCULAR | Status: DC | PRN

## 2024-03-04 MED ORDER — SODIUM CHLORIDE 0.9 % IV SOLN: 500.0000 mg | Freq: Once | INTRAVENOUS | Status: DC

## 2024-03-04 MED ORDER — FUROSEMIDE 10 MG/ML IJ SOLN
40.0000 mg | Freq: Once | INTRAMUSCULAR | Status: AC
  Administered 2024-03-04: 40 mg via INTRAVENOUS
  Filled 2024-03-04: qty 4

## 2024-03-04 MED ORDER — LEVOFLOXACIN IN D5W 750 MG/150ML IV SOLN
750.0000 mg | INTRAVENOUS | Status: DC
  Administered 2024-03-05: 750 mg via INTRAVENOUS
  Filled 2024-03-04: qty 150

## 2024-03-04 MED ORDER — ONDANSETRON HCL 4 MG PO TABS: 4.0000 mg | ORAL_TABLET | Freq: Four times a day (QID) | ORAL | Status: DC | PRN

## 2024-03-04 MED ORDER — SODIUM CHLORIDE 0.9 % IV BOLUS: 500.0000 mL | Freq: Once | INTRAVENOUS | Status: DC

## 2024-03-04 MED ORDER — SODIUM CHLORIDE 0.9 % IV SOLN
1.0000 g | Freq: Once | INTRAVENOUS | Status: AC
  Administered 2024-03-04: 1 g via INTRAVENOUS
  Filled 2024-03-04: qty 10

## 2024-03-04 MED ORDER — POLYETHYLENE GLYCOL 3350 17 G PO PACK
17.0000 g | PACK | Freq: Every day | ORAL | Status: DC | PRN
  Administered 2024-03-05: 17 g via ORAL
  Filled 2024-03-04: qty 1

## 2024-03-04 MED ORDER — IPRATROPIUM-ALBUTEROL 0.5-2.5 (3) MG/3ML IN SOLN
3.0000 mL | Freq: Once | RESPIRATORY_TRACT | Status: AC
  Administered 2024-03-04: 3 mL via RESPIRATORY_TRACT
  Filled 2024-03-04: qty 3

## 2024-03-04 MED ORDER — IOHEXOL 350 MG/ML SOLN
75.0000 mL | Freq: Once | INTRAVENOUS | Status: AC | PRN
  Administered 2024-03-04: 75 mL via INTRAVENOUS

## 2024-03-04 MED ORDER — HYDROCODONE BIT-HOMATROP MBR 5-1.5 MG/5ML PO SOLN
5.0000 mL | Freq: Once | ORAL | Status: AC
  Administered 2024-03-04: 5 mL via ORAL
  Filled 2024-03-04: qty 5

## 2024-03-04 NOTE — H&P (Signed)
 History and Physical    Patient: Peter Levy ZOX:[id number] DOB: July 22, 1956 DOA: 03/04/2024 DOS: the patient was seen and examined on 03/04/2024 PCP: Minus Amel, MD  Patient coming from: Home  Chief Complaint:  Chief Complaint  Patient presents with   Cough   HPI: Peter Levy is a 68 y.o. male with medical history significant of cerebral palsy, recent admission for community-acquired pneumonia. He was treated and discharged to home. He returns due to increasing fatigue, DOE, cough that is not improving despite completion of antibiotics.  There are some times where he is unable to catch his breath due to coughing spells.  No fevers, chills.  He denies chest pain.  He reports stooling, but he has not been eating well.  Review of Systems: As mentioned in the history of present illness. All other systems reviewed and are negative. Past Medical History:  Diagnosis Date   CP (cerebral palsy) (HCC)    Past Surgical History:  Procedure Laterality Date   COLONOSCOPY     FLEXIBLE SIGMOIDOSCOPY N/A 05/14/2018   Procedure: FLEXIBLE SIGMOIDOSCOPY;  Surgeon: Alanda Allegra, MD;  Location: [facility];  Service: Gastroenterology;  Laterality: N/A;   none     none   Social History:  reports that he has never smoked. He has never used smokeless tobacco. He reports that he does not drink alcohol and does not use drugs.  No Known Allergies  History reviewed. No pertinent family history.  Prior to Admission medications   Medication Sig Start Date End Date Taking? Authorizing Provider  albuterol  (VENTOLIN  HFA) 108 (90 Base) MCG/ACT inhaler Inhale 2 puffs into the lungs every 4 (four) hours as needed for wheezing or shortness of breath. 08/28/21   Colin Dawley, MD  benzonatate  (TESSALON ) 200 MG capsule Take 1 capsule (200 mg total) by mouth 2 (two) times daily as needed for cough. 08/28/21   Colin Dawley, MD  BREZTRI AEROSPHERE 160-9-4.8 MCG/ACT AERO Inhale 2 puffs into the lungs in  the morning and at bedtime. 09/29/22   [provider]  busPIRone  (BUSPAR ) 5 MG tablet Take 1 tablet (5 mg total) by mouth 2 (two) times daily. 08/28/21   Colin Dawley, MD  Cholecalciferol (VITAMIN D3) 50 MCG (2000 UT) capsule Take 2,000 Units by mouth daily.    [provider]  cyclobenzaprine  (FLEXERIL ) 10 MG tablet Take 10 mg by mouth 3 (three) times daily. 02/15/24   [provider]  Ergocalciferol (VITAMIN D2 PO) Take 1 tablet by mouth daily.    [provider]  GAS RELIEF 125 MG CAPS Take by mouth. 01/15/24   [provider]  GOODSENSE CLEARLAX 17 GM/SCOOP powder Take 17 g by mouth daily. 01/15/24   [provider]  polyethylene glycol (MIRALAX  / GLYCOLAX ) 17 g packet Take 1 packet by mouth daily. 02/15/24   [provider]  Vitamin D, Ergocalciferol, 50000 units CAPS Take 1 capsule by mouth once a week. 09/17/22   [provider]    Physical Exam: Vitals:   03/04/24 1930 03/04/24 2000 03/04/24 2030 03/04/24 2100  BP: 126/68 114/75 126/60 (!) 134/123  Pulse: (!) 110 (!) 102  (!) 101  Resp:    (!) 27  Temp:    99 F (37.2 C)  TempSrc:      SpO2: 98% 97%  98%  Weight:      Height:       General: Elderly male. Awake and alert. No acute cardiopulmonary distress.  HEENT: Normocephalic atraumatic.  Right and left ears normal in appearance.  Pupils equal, round, reactive to light. Extraocular muscles are intact. Sclerae anicteric and noninjected.  Moist mucosal membranes. No mucosal lesions.  Neck: Neck supple without lymphadenopathy. No carotid bruits. No masses palpated.  Cardiovascular: Regular rate with normal S1-S2 sounds. No murmurs, rubs, gallops auscultated. No JVD.  Respiratory: Diminished breath sounds on the right side.  Rales on the right base to the mid lung field.  No accessory muscle use. Abdomen: Soft, nontender, nondistended. Active bowel sounds. No masses or hepatosplenomegaly  Skin: No rashes,  lesions, or ulcerations.  Dry, warm to touch. 2+ dorsalis pedis and radial pulses. Musculoskeletal: No calf or leg pain. All major joints not erythematous nontender.  No upper or lower joint deformation.  Good ROM.  No contractures  Psychiatric: Intact judgment and insight. Pleasant and cooperative. Neurologic: No focal neurological deficits. Strength is 5/5 and symmetric in upper and lower extremities.  Cranial nerves II through XII are grossly intact.  Data Reviewed: Results for orders placed or performed during the hospital encounter of 03/04/24 (from the past 24 hours)  CBC with Differential     Status: Abnormal   Collection Time: 03/04/24  2:53 PM  Result Value Ref Range   WBC 6.1 4.0 - 10.5 K/uL   RBC 4.03 (L) 4.22 - 5.81 MIL/uL   Hemoglobin 12.0 (L) 13.0 - 17.0 g/dL   HCT 40.9 (L) 81.1 - 91.4 %   MCV 93.5 80.0 - 100.0 fL   MCH 29.8 26.0 - 34.0 pg   MCHC 31.8 30.0 - 36.0 g/dL   RDW 78.2 95.6 - 21.3 %   Platelets 451 (H) 150 - 400 K/uL   nRBC 0.0 0.0 - 0.2 %   Neutrophils Relative % 55 %   Neutro Abs 3.4 1.7 - 7.7 K/uL   Lymphocytes Relative 26 %   Lymphs Abs 1.6 0.7 - 4.0 K/uL   Monocytes Relative 15 %   Monocytes Absolute 0.9 0.1 - 1.0 K/uL   Eosinophils Relative 3 %   Eosinophils Absolute 0.2 0.0 - 0.5 K/uL   Basophils Relative 1 %   Basophils Absolute 0.0 0.0 - 0.1 K/uL   Immature Granulocytes 0 %   Abs Immature Granulocytes 0.01 0.00 - 0.07 K/uL  Basic metabolic panel     Status: Abnormal   Collection Time: 03/04/24  2:53 PM  Result Value Ref Range   Sodium 135 135 - 145 mmol/L   Potassium 4.3 3.5 - 5.1 mmol/L   Chloride 103 98 - 111 mmol/L   CO2 23 22 - 32 mmol/L   Glucose, Bld 101 (H) 70 - 99 mg/dL   BUN 10 8 - 23 mg/dL   Creatinine, Ser 0.86 0.61 - 1.24 mg/dL   Calcium 9.1 8.9 - 57.8 mg/dL   GFR, Estimated >46 >96 mL/min   Anion gap 9 5 - 15  Brain natriuretic peptide     Status: None   Collection Time: 03/04/24  4:04 PM  Result Value Ref Range   B  Natriuretic Peptide 54.0 0.0 - 100.0 pg/mL  Troponin I (High Sensitivity)     Status: None   Collection Time: 03/04/24  4:05 PM  Result Value Ref Range   Troponin I (High Sensitivity) 7 <18 ng/L  Lactic acid, plasma     Status: None   Collection Time: 03/04/24  4:05 PM  Result Value Ref Range   Lactic Acid, Venous 1.3 0.5 - 1.9 mmol/L  Resp panel by RT-PCR (RSV, Flu  A&B, Covid) Anterior Nasal Swab     Status: None   Collection Time: 03/04/24  4:05 PM   Specimen: Anterior Nasal Swab  Result Value Ref Range   SARS Coronavirus 2 by RT PCR NEGATIVE NEGATIVE   Influenza A by PCR NEGATIVE NEGATIVE   Influenza B by PCR NEGATIVE NEGATIVE   Resp Syncytial Virus by PCR NEGATIVE NEGATIVE  Lactic acid, plasma     Status: None   Collection Time: 03/04/24  5:43 PM  Result Value Ref Range   Lactic Acid, Venous 1.4 0.5 - 1.9 mmol/L  Troponin I (High Sensitivity)     Status: None   Collection Time: 03/04/24  5:43 PM  Result Value Ref Range   Troponin I (High Sensitivity) 7 <18 ng/L    CT Angio Chest PE W and/or Wo Contrast Result Date: 03/04/2024 CLINICAL DATA:  Shortness of breath, recent admission for pneumonia. Ongoing cough and fatigue. EXAM: CT ANGIOGRAPHY CHEST CT ABDOMEN AND PELVIS WITH CONTRAST TECHNIQUE: Multidetector CT imaging of the chest was performed using the standard protocol during bolus administration of intravenous contrast. Multiplanar CT image reconstructions and MIPs were obtained to evaluate the vascular anatomy. Multidetector CT imaging of the abdomen and pelvis was performed using the standard protocol during bolus administration of intravenous contrast. RADIATION DOSE REDUCTION: This exam was performed according to the departmental dose-optimization program which includes automated exposure control, adjustment of the mA and/or kV according to patient size and/or use of iterative reconstruction technique. CONTRAST:  75mL OMNIPAQUE  IOHEXOL  350 MG/ML SOLN COMPARISON:  Same day chest  radiograph; CT abdomen pelvis 10/10/2022; CT chest 02/22/2024 FINDINGS: CTA CHEST FINDINGS Cardiovascular: Respiratory and cardiac motion limits evaluation. No central pulmonary embolism. The lobar, segmental, and subsegmental pulmonary arteries are obscured by motion. No pericardial effusion. Normal caliber thoracic aorta. Mediastinum/Nodes: Trachea and esophagus are unremarkable. No definite adenopathy. Lungs/Pleura: Large right pleural effusion with near complete atelectasis of the right lower lobe. Subtotal atelectasis of the right middle lobe. Slight decrease in consolidation in the anterior right upper lobe and sub adjacent right middle lobe (series 6/image 69-75). The left lung is clear. No pneumothorax. Musculoskeletal: No acute fracture. Review of the MIP images confirms the above findings. CT ABDOMEN and PELVIS FINDINGS Hepatobiliary: No acute abnormality. Pancreas: Unremarkable. Spleen: Unremarkable. Adrenals/Urinary Tract: Normal adrenal glands. Cortical geographic hypoattenuation within the left kidney and to a lesser extent in the right kidney. This is favored artifactual due to motion and adjacent streak artifact from dense contrast in the colon. Pyelonephritis could appear similarly. No urinary calculi or hydronephrosis. Unremarkable bladder. Stomach/Bowel: Stomach is within normal limits. Normal caliber small bowel. Dense stool mixed with contrast in the ascending and distal descending/sigmoid colon. Increased marked gaseous distention measuring up to 11.5 cm of the colon in the left upper quadrant it is unclear if this represents redundant sigmoid colon or transverse colon. Vascular/Lymphatic: No significant vascular findings are present. No enlarged abdominal or pelvic lymph nodes. Reproductive: Unremarkable. Other: No free intraperitoneal fluid or air. Left inguinal hernia containing a high riding testicle. Musculoskeletal: No acute fracture. Review of the MIP images confirms the above findings.  IMPRESSION: 1. No central pulmonary embolism. The lobar, segmental, and subsegmental pulmonary arteries are obscured by motion. 2. Large right pleural effusion with near complete atelectasis of the right lower lobe and subtotal atelectasis of the right middle lobe. 3. Slight decrease in consolidation in the anterior right upper lobe and sub adjacent right middle lobe. 4. Follow-up of these findings in 6-8  weeks after treatment is recommended to ensure resolution and exclude underlying mass. 5. Increased marked gaseous distention measuring up to 11.5 cm of the colon in the left upper quadrant. It is unclear if this represents redundant sigmoid colon or transverse colon. This may represent colonic pseudo-obstruction as there is contrast mixed with stool in the distal sigmoid colon and rectum. 6. Cortical geographic hypoattenuation within the left kidney and to a lesser extent in the right kidney. This is favored artifactual due to motion and adjacent streak artifact from dense contrast in the colon. Pyelonephritis could appear similarly. Correlate with urinalysis. Electronically Signed   By: Rozell Cornet M.D.   On: 03/04/2024 21:05   CT ABDOMEN PELVIS W CONTRAST Result Date: 03/04/2024 CLINICAL DATA:  Shortness of breath, recent admission for pneumonia. Ongoing cough and fatigue. EXAM: CT ANGIOGRAPHY CHEST CT ABDOMEN AND PELVIS WITH CONTRAST TECHNIQUE: Multidetector CT imaging of the chest was performed using the standard protocol during bolus administration of intravenous contrast. Multiplanar CT image reconstructions and MIPs were obtained to evaluate the vascular anatomy. Multidetector CT imaging of the abdomen and pelvis was performed using the standard protocol during bolus administration of intravenous contrast. RADIATION DOSE REDUCTION: This exam was performed according to the departmental dose-optimization program which includes automated exposure control, adjustment of the mA and/or kV according to  patient size and/or use of iterative reconstruction technique. CONTRAST:  75mL OMNIPAQUE  IOHEXOL  350 MG/ML SOLN COMPARISON:  Same day chest radiograph; CT abdomen pelvis 10/10/2022; CT chest 02/22/2024 FINDINGS: CTA CHEST FINDINGS Cardiovascular: Respiratory and cardiac motion limits evaluation. No central pulmonary embolism. The lobar, segmental, and subsegmental pulmonary arteries are obscured by motion. No pericardial effusion. Normal caliber thoracic aorta. Mediastinum/Nodes: Trachea and esophagus are unremarkable. No definite adenopathy. Lungs/Pleura: Large right pleural effusion with near complete atelectasis of the right lower lobe. Subtotal atelectasis of the right middle lobe. Slight decrease in consolidation in the anterior right upper lobe and sub adjacent right middle lobe (series 6/image 69-75). The left lung is clear. No pneumothorax. Musculoskeletal: No acute fracture. Review of the MIP images confirms the above findings. CT ABDOMEN and PELVIS FINDINGS Hepatobiliary: No acute abnormality. Pancreas: Unremarkable. Spleen: Unremarkable. Adrenals/Urinary Tract: Normal adrenal glands. Cortical geographic hypoattenuation within the left kidney and to a lesser extent in the right kidney. This is favored artifactual due to motion and adjacent streak artifact from dense contrast in the colon. Pyelonephritis could appear similarly. No urinary calculi or hydronephrosis. Unremarkable bladder. Stomach/Bowel: Stomach is within normal limits. Normal caliber small bowel. Dense stool mixed with contrast in the ascending and distal descending/sigmoid colon. Increased marked gaseous distention measuring up to 11.5 cm of the colon in the left upper quadrant it is unclear if this represents redundant sigmoid colon or transverse colon. Vascular/Lymphatic: No significant vascular findings are present. No enlarged abdominal or pelvic lymph nodes. Reproductive: Unremarkable. Other: No free intraperitoneal fluid or air. Left  inguinal hernia containing a high riding testicle. Musculoskeletal: No acute fracture. Review of the MIP images confirms the above findings. IMPRESSION: 1. No central pulmonary embolism. The lobar, segmental, and subsegmental pulmonary arteries are obscured by motion. 2. Large right pleural effusion with near complete atelectasis of the right lower lobe and subtotal atelectasis of the right middle lobe. 3. Slight decrease in consolidation in the anterior right upper lobe and sub adjacent right middle lobe. 4. Follow-up of these findings in 6-8 weeks after treatment is recommended to ensure resolution and exclude underlying mass. 5. Increased marked gaseous distention measuring up  to 11.5 cm of the colon in the left upper quadrant. It is unclear if this represents redundant sigmoid colon or transverse colon. This may represent colonic pseudo-obstruction as there is contrast mixed with stool in the distal sigmoid colon and rectum. 6. Cortical geographic hypoattenuation within the left kidney and to a lesser extent in the right kidney. This is favored artifactual due to motion and adjacent streak artifact from dense contrast in the colon. Pyelonephritis could appear similarly. Correlate with urinalysis. Electronically Signed   By: Rozell Cornet M.D.   On: 03/04/2024 21:05   DG Chest 2 View Result Date: 03/04/2024 CLINICAL DATA:  Cough and fatigue EXAM: CHEST - 2 VIEW COMPARISON:  February 22, 2024 FINDINGS: No cardiomegaly. Tortuous aorta. Small to moderate right pleural effusion, slightly increased in the interim. Compressive atelectasis in the right lung base. No pneumothorax. Gaseous distension of the colon in the left upper quadrant. IMPRESSION: Slightly increased size of the small to moderate right pleural effusion. Electronically Signed   By: Rance Burrows M.D.   On: 03/04/2024 17:34     Assessment and Plan: No notes have been filed under this hospital service. Service: Hospitalist  Principal  Problem:   Pseudo-obstruction of colon Active Problems:   Cerebral palsy (HCC)   Multifocal pneumonia   Pleural effusion  Hypoxia with multifocal pneumonia Will change antibiotics to Levaquin  as he was just recently treated with Rocephin  and azithromycin  Blood cultures Procalcitonin Patient may need thoracentesis.  Will need to consult with interventional radiology CBC in the morning Pleural effusion As above: May need thoracentesis.  Will consult with interventional radiology tomorrow morning Cerebral palsy Pseudoobstruction of colon MiraLAX  as needed General Surgery to see tomorrow   Advance Care Planning:   Code Status: Full Code confirmed by patient's caregiver  Consults: gen surgery.Will need IR consult for thoracentesis  Family Communication: none  Severity of Illness: The appropriate patient status for this patient is INPATIENT. Inpatient status is judged to be reasonable and necessary in order to provide the required intensity of service to ensure the patient's safety. The patient's presenting symptoms, physical exam findings, and initial radiographic and laboratory data in the context of their chronic comorbidities is felt to place them at high risk for further clinical deterioration. Furthermore, it is not anticipated that the patient will be medically stable for discharge from the hospital within 2 midnights of admission.   * I certify that at the point of admission it is my clinical judgment that the patient will require inpatient hospital care spanning beyond 2 midnights from the point of admission due to high intensity of service, high risk for further deterioration and high frequency of surveillance required.*  Author: Ryden Wainer J Tamerra Merkley, DO 03/04/2024 10:00 PM  For on call review [email or web address].

## 2024-03-04 NOTE — ED Provider Notes (Signed)
 [place] EMERGENCY DEPARTMENT AT [facility] Provider Note  CSN: [id number] Arrival date & time: 03/04/24 1422  Chief Complaint(s) Cough  HPI Peter Levy is a 68 y.o. male with past medical history as below, significant for cerebral palsy, recent pneumonia who presents to the ED with complaint of cough, dyspnea Patient is here with family.  He was recently admitted with pneumonia, discharged on 4/11.  Family reports he has been having worsening cough, difficulty breathing since discharge.  Antitussive  has not been helpful.  Family reports he is frequently gasping for air when he attempts to ambulate.  Does not use home oxygen .  No fevers or chills over the past 24 hours.  No nausea or vomiting.  Cough is nonproductive.  No rashes.  He has poor appetite.  Past Medical History Past Medical History:  Diagnosis Date   CP (cerebral palsy) [facility])    Patient Active Problem List   Diagnosis Date Noted   Normocytic anemia 02/23/2024   Multifocal pneumonia 02/22/2024   Rhabdomyolysis 08/27/2021   Cerebral palsy (HCC) 08/27/2021   Special screening for malignant neoplasms, colon    Failed attempted surgical procedure    Home Medication(s) Prior to Admission medications   Medication Sig Start Date End Date Taking? Authorizing Provider  albuterol  (VENTOLIN  HFA) 108 (90 Base) MCG/ACT inhaler Inhale 2 puffs into the lungs every 4 (four) hours as needed for wheezing or shortness of breath. 08/28/21   Colin Dawley, MD  benzonatate  (TESSALON ) 200 MG capsule Take 1 capsule (200 mg total) by mouth 2 (two) times daily as needed for cough. 08/28/21   Colin Dawley, MD  BREZTRI AEROSPHERE 160-9-4.8 MCG/ACT AERO Inhale 2 puffs into the lungs in the morning and at bedtime. 09/29/22   [provider]  busPIRone  (BUSPAR ) 5 MG tablet Take 1 tablet (5 mg total) by mouth 2 (two) times daily. 08/28/21   Colin Dawley, MD  Cholecalciferol (VITAMIN D3) 50 MCG (2000 UT) capsule Take  2,000 Units by mouth daily.    [provider]  cyclobenzaprine  (FLEXERIL ) 10 MG tablet Take 10 mg by mouth 3 (three) times daily. 02/15/24   [provider]  Ergocalciferol (VITAMIN D2 PO) Take 1 tablet by mouth daily.    [provider]  GAS RELIEF 125 MG CAPS Take by mouth. 01/15/24   [provider]  GOODSENSE CLEARLAX 17 GM/SCOOP powder Take 17 g by mouth daily. 01/15/24   [provider]  polyethylene glycol (MIRALAX  / GLYCOLAX ) 17 g packet Take 1 packet by mouth daily. 02/15/24   [provider]  Vitamin D, Ergocalciferol, 50000 units CAPS Take 1 capsule by mouth once a week. 09/17/22   [provider]                                                                                                                                    Past Surgical History Past Surgical History:  Procedure Laterality Date   COLONOSCOPY     FLEXIBLE SIGMOIDOSCOPY N/A 05/14/2018   Procedure: FLEXIBLE SIGMOIDOSCOPY;  Surgeon: Alanda Allegra, MD;  Location: [facility];  Service: Gastroenterology;  Laterality: N/A;   none     none   Family History History reviewed. No pertinent family history.  Social History Social History   Tobacco Use   Smoking status: Never   Smokeless tobacco: Never  Vaping Use   Vaping status: Never Used  Substance Use Topics   Alcohol use: No   Drug use: No   Allergies Patient has no known allergies.  Review of Systems A thorough review of systems was obtained and all systems are negative except as noted in the HPI and PMH.   Physical Exam Vital Signs  I have reviewed the triage vital signs BP (!) 134/123 (BP Location: Left Arm)   Pulse (!) 101   Temp 99 F (37.2 C)   Resp (!) 27   Ht 5\' 5"  (1.651 m)   Wt 71.4 kg   SpO2 98%   BMI 26.19 kg/m  Physical Exam Vitals and nursing note reviewed.  Constitutional:      General: He is not in acute distress.    Appearance: He is well-developed.  HENT:      Head: Normocephalic and atraumatic.     Right Ear: External ear normal.     Left Ear: External ear normal.     Mouth/Throat:     Mouth: Mucous membranes are moist.  Eyes:     General: No scleral icterus. Cardiovascular:     Rate and Rhythm: Regular rhythm. Tachycardia present.     Pulses: Normal pulses.     Heart sounds: Normal heart sounds.  Pulmonary:     Effort: Tachypnea and accessory muscle usage present. No respiratory distress.     Breath sounds: Decreased breath sounds present.     Comments: Breath sounds diminished on the right Abdominal:     General: Abdomen is flat.     Palpations: Abdomen is soft.     Tenderness: There is no abdominal tenderness.  Musculoskeletal:     Cervical back: No rigidity.     Right lower leg: No edema.     Left lower leg: No edema.  Skin:    General: Skin is warm and dry.     Capillary Refill: Capillary refill takes less than 2 seconds.  Neurological:     Mental Status: He is alert.  Psychiatric:        Mood and Affect: Mood normal.        Behavior: Behavior normal.     ED Results and Treatments Labs (all labs ordered are listed, but only abnormal results are displayed) Labs Reviewed  CBC WITH DIFFERENTIAL/PLATELET - Abnormal; Notable for the following components:      Result Value   RBC 4.03 (*)    Hemoglobin 12.0 (*)    HCT 37.7 (*)    Platelets 451 (*)    All other components within normal limits  BASIC METABOLIC PANEL WITH GFR - Abnormal; Notable for the following components:   Glucose, Bld 101 (*)    All other components within normal limits  RESP PANEL BY RT-PCR (RSV, FLU A&B, COVID)  RVPGX2  EXPECTORATED SPUTUM ASSESSMENT W GRAM STAIN, RFLX TO RESP C  BRAIN NATRIURETIC PEPTIDE  LACTIC ACID, PLASMA  LACTIC ACID, PLASMA  URINALYSIS, ROUTINE W REFLEX MICROSCOPIC  TROPONIN I (HIGH SENSITIVITY)  TROPONIN I (HIGH SENSITIVITY)  Radiology CT Angio Chest PE W and/or Wo Contrast Result Date: 03/04/2024 CLINICAL DATA:  Shortness of breath, recent admission for pneumonia. Ongoing cough and fatigue. EXAM: CT ANGIOGRAPHY CHEST CT ABDOMEN AND PELVIS WITH CONTRAST TECHNIQUE: Multidetector CT imaging of the chest was performed using the standard protocol during bolus administration of intravenous contrast. Multiplanar CT image reconstructions and MIPs were obtained to evaluate the vascular anatomy. Multidetector CT imaging of the abdomen and pelvis was performed using the standard protocol during bolus administration of intravenous contrast. RADIATION DOSE REDUCTION: This exam was performed according to the departmental dose-optimization program which includes automated exposure control, adjustment of the mA and/or kV according to patient size and/or use of iterative reconstruction technique. CONTRAST:  75mL OMNIPAQUE  IOHEXOL  350 MG/ML SOLN COMPARISON:  Same day chest radiograph; CT abdomen pelvis 10/10/2022; CT chest 02/22/2024 FINDINGS: CTA CHEST FINDINGS Cardiovascular: Respiratory and cardiac motion limits evaluation. No central pulmonary embolism. The lobar, segmental, and subsegmental pulmonary arteries are obscured by motion. No pericardial effusion. Normal caliber thoracic aorta. Mediastinum/Nodes: Trachea and esophagus are unremarkable. No definite adenopathy. Lungs/Pleura: Large right pleural effusion with near complete atelectasis of the right lower lobe. Subtotal atelectasis of the right middle lobe. Slight decrease in consolidation in the anterior right upper lobe and sub adjacent right middle lobe (series 6/image 69-75). The left lung is clear. No pneumothorax. Musculoskeletal: No acute fracture. Review of the MIP images confirms the above findings. CT ABDOMEN and PELVIS FINDINGS Hepatobiliary: No acute abnormality. Pancreas: Unremarkable. Spleen: Unremarkable. Adrenals/Urinary Tract: Normal adrenal glands.  Cortical geographic hypoattenuation within the left kidney and to a lesser extent in the right kidney. This is favored artifactual due to motion and adjacent streak artifact from dense contrast in the colon. Pyelonephritis could appear similarly. No urinary calculi or hydronephrosis. Unremarkable bladder. Stomach/Bowel: Stomach is within normal limits. Normal caliber small bowel. Dense stool mixed with contrast in the ascending and distal descending/sigmoid colon. Increased marked gaseous distention measuring up to 11.5 cm of the colon in the left upper quadrant it is unclear if this represents redundant sigmoid colon or transverse colon. Vascular/Lymphatic: No significant vascular findings are present. No enlarged abdominal or pelvic lymph nodes. Reproductive: Unremarkable. Other: No free intraperitoneal fluid or air. Left inguinal hernia containing a high riding testicle. Musculoskeletal: No acute fracture. Review of the MIP images confirms the above findings. IMPRESSION: 1. No central pulmonary embolism. The lobar, segmental, and subsegmental pulmonary arteries are obscured by motion. 2. Large right pleural effusion with near complete atelectasis of the right lower lobe and subtotal atelectasis of the right middle lobe. 3. Slight decrease in consolidation in the anterior right upper lobe and sub adjacent right middle lobe. 4. Follow-up of these findings in 6-8 weeks after treatment is recommended to ensure resolution and exclude underlying mass. 5. Increased marked gaseous distention measuring up to 11.5 cm of the colon in the left upper quadrant. It is unclear if this represents redundant sigmoid colon or transverse colon. This may represent colonic pseudo-obstruction as there is contrast mixed with stool in the distal sigmoid colon and rectum. 6. Cortical geographic hypoattenuation within the left kidney and to a lesser extent in the right kidney. This is favored artifactual due to motion and adjacent streak  artifact from dense contrast in the colon. Pyelonephritis could appear similarly. Correlate with urinalysis. Electronically Signed   By: Rozell Cornet M.D.   On: 03/04/2024 21:05   CT ABDOMEN PELVIS W CONTRAST Result Date: 03/04/2024 CLINICAL DATA:  Shortness of breath, recent  admission for pneumonia. Ongoing cough and fatigue. EXAM: CT ANGIOGRAPHY CHEST CT ABDOMEN AND PELVIS WITH CONTRAST TECHNIQUE: Multidetector CT imaging of the chest was performed using the standard protocol during bolus administration of intravenous contrast. Multiplanar CT image reconstructions and MIPs were obtained to evaluate the vascular anatomy. Multidetector CT imaging of the abdomen and pelvis was performed using the standard protocol during bolus administration of intravenous contrast. RADIATION DOSE REDUCTION: This exam was performed according to the departmental dose-optimization program which includes automated exposure control, adjustment of the mA and/or kV according to patient size and/or use of iterative reconstruction technique. CONTRAST:  75mL OMNIPAQUE  IOHEXOL  350 MG/ML SOLN COMPARISON:  Same day chest radiograph; CT abdomen pelvis 10/10/2022; CT chest 02/22/2024 FINDINGS: CTA CHEST FINDINGS Cardiovascular: Respiratory and cardiac motion limits evaluation. No central pulmonary embolism. The lobar, segmental, and subsegmental pulmonary arteries are obscured by motion. No pericardial effusion. Normal caliber thoracic aorta. Mediastinum/Nodes: Trachea and esophagus are unremarkable. No definite adenopathy. Lungs/Pleura: Large right pleural effusion with near complete atelectasis of the right lower lobe. Subtotal atelectasis of the right middle lobe. Slight decrease in consolidation in the anterior right upper lobe and sub adjacent right middle lobe (series 6/image 69-75). The left lung is clear. No pneumothorax. Musculoskeletal: No acute fracture. Review of the MIP images confirms the above findings. CT ABDOMEN and PELVIS  FINDINGS Hepatobiliary: No acute abnormality. Pancreas: Unremarkable. Spleen: Unremarkable. Adrenals/Urinary Tract: Normal adrenal glands. Cortical geographic hypoattenuation within the left kidney and to a lesser extent in the right kidney. This is favored artifactual due to motion and adjacent streak artifact from dense contrast in the colon. Pyelonephritis could appear similarly. No urinary calculi or hydronephrosis. Unremarkable bladder. Stomach/Bowel: Stomach is within normal limits. Normal caliber small bowel. Dense stool mixed with contrast in the ascending and distal descending/sigmoid colon. Increased marked gaseous distention measuring up to 11.5 cm of the colon in the left upper quadrant it is unclear if this represents redundant sigmoid colon or transverse colon. Vascular/Lymphatic: No significant vascular findings are present. No enlarged abdominal or pelvic lymph nodes. Reproductive: Unremarkable. Other: No free intraperitoneal fluid or air. Left inguinal hernia containing a high riding testicle. Musculoskeletal: No acute fracture. Review of the MIP images confirms the above findings. IMPRESSION: 1. No central pulmonary embolism. The lobar, segmental, and subsegmental pulmonary arteries are obscured by motion. 2. Large right pleural effusion with near complete atelectasis of the right lower lobe and subtotal atelectasis of the right middle lobe. 3. Slight decrease in consolidation in the anterior right upper lobe and sub adjacent right middle lobe. 4. Follow-up of these findings in 6-8 weeks after treatment is recommended to ensure resolution and exclude underlying mass. 5. Increased marked gaseous distention measuring up to 11.5 cm of the colon in the left upper quadrant. It is unclear if this represents redundant sigmoid colon or transverse colon. This may represent colonic pseudo-obstruction as there is contrast mixed with stool in the distal sigmoid colon and rectum. 6. Cortical geographic  hypoattenuation within the left kidney and to a lesser extent in the right kidney. This is favored artifactual due to motion and adjacent streak artifact from dense contrast in the colon. Pyelonephritis could appear similarly. Correlate with urinalysis. Electronically Signed   By: Rozell Cornet M.D.   On: 03/04/2024 21:05   DG Chest 2 View Result Date: 03/04/2024 CLINICAL DATA:  Cough and fatigue EXAM: CHEST - 2 VIEW COMPARISON:  February 22, 2024 FINDINGS: No cardiomegaly. Tortuous aorta. Small to moderate right pleural effusion,  slightly increased in the interim. Compressive atelectasis in the right lung base. No pneumothorax. Gaseous distension of the colon in the left upper quadrant. IMPRESSION: Slightly increased size of the small to moderate right pleural effusion. Electronically Signed   By: Rance Burrows M.D.   On: 03/04/2024 17:34    Pertinent labs & imaging results that were available during my care of the patient were reviewed by me and considered in my medical decision making (see MDM for details).  Medications Ordered in ED Medications  furosemide  (LASIX ) injection 40 mg (has no administration in time range)  cefTRIAXone  (ROCEPHIN ) 1 g in sodium chloride  0.9 % 100 mL IVPB (has no administration in time range)  azithromycin  (ZITHROMAX ) 500 mg in sodium chloride  0.9 % 250 mL IVPB (has no administration in time range)  ipratropium-albuterol  (DUONEB) 0.5-2.5 (3) MG/3ML nebulizer solution 3 mL (3 mLs Nebulization Given 03/04/24 1615)  HYDROcodone  bit-homatropine (HYCODAN) 5-1.5 MG/5ML syrup 5 mL (5 mLs Oral Given 03/04/24 1657)  iohexol  (OMNIPAQUE ) 350 MG/ML injection 75 mL (75 mLs Intravenous Contrast Given 03/04/24 1909)                                                                                                                                     Procedures Procedures  (including critical care time)  Medical Decision Making / ED Course    Medical Decision Making:    Peter Levy is a 68 y.o. male with past medical history as below, significant for cerebral palsy, recent pneumonia who presents to the ED with complaint of cough, dyspnea. The complaint involves an extensive differential diagnosis and also carries with it a high risk of complications and morbidity.  Serious etiology was considered. Ddx includes but is not limited to: In my evaluation of this patient's dyspnea my DDx includes, but is not limited to, pneumonia, pulmonary embolism, pneumothorax, pulmonary edema, metabolic acidosis, asthma, COPD, cardiac cause, anemia, anxiety, etc.    Complete initial physical exam performed, notably the patient was in mild respiratory distress, increased work of breathing, diminished on the right.    Reviewed and confirmed nursing documentation for past medical history, family history, social history.  Vital signs reviewed.    Clinical Course as of 03/04/24 2124  Fri Mar 04, 2024  2113 CTAP with markedly distended colon, mildly worse from prior CT 11/24 per radiology. He has no n/v, no sig belly pain  He also has large pleural effusion on his CT chest, markedly worse from prior CTA earlier this month.  He has worsening difficulty breathing, is requiring supplemental oxygen .  Will likely benefit from thoracentesis.  I will recommend the patient for admission. [SG]  2121 Spoke w/ dr Larrie Po, will see in consult tomorrow [SG]    Clinical Course User Index [SG] Teddi Favors, DO    Brief summary: 68 year old male history above including cervalgia, recent mission for pneumonia here with dyspnea, cough Symptoms  gradually worsened over the past week of the time of discharge per family. No relief with oral antitussives  Patient is feeling somewhat better, with repositioning/ attempting ambulation he becomes severely dyspneic.  Patient has notable large pleural effusion on his CT, worsened from prior.  Also possible lingering pneumonia.  Recommend admission for thoracentesis,  difficulty with breathing, possible ongoing PNA. Start abx, start lasix . Not septic. HDS.   Spoke with Dr. Larrie Po about abnormal CT, they will see in consult tomorrow.  Admit hospitalist                Additional history obtained: -Additional history obtained from family and caregiver -External records from outside source obtained and reviewed including: Chart review including previous notes, labs, imaging, consultation notes including  Recent admission Prior imaging Prior labs   Lab Tests: -I ordered, reviewed, and interpreted labs.   The pertinent results include:   Labs Reviewed  CBC WITH DIFFERENTIAL/PLATELET - Abnormal; Notable for the following components:      Result Value   RBC 4.03 (*)    Hemoglobin 12.0 (*)    HCT 37.7 (*)    Platelets 451 (*)    All other components within normal limits  BASIC METABOLIC PANEL WITH GFR - Abnormal; Notable for the following components:   Glucose, Bld 101 (*)    All other components within normal limits  RESP PANEL BY RT-PCR (RSV, FLU A&B, COVID)  RVPGX2  EXPECTORATED SPUTUM ASSESSMENT W GRAM STAIN, RFLX TO RESP C  BRAIN NATRIURETIC PEPTIDE  LACTIC ACID, PLASMA  LACTIC ACID, PLASMA  URINALYSIS, ROUTINE W REFLEX MICROSCOPIC  TROPONIN I (HIGH SENSITIVITY)  TROPONIN I (HIGH SENSITIVITY)    Notable for labs stable  EKG   EKG Interpretation Date/Time:  Friday March 04 2024 15:58:02 EDT Ventricular Rate:  110 PR Interval:  145 QRS Duration:  79 QT Interval:  325 QTC Calculation: 440 R Axis:   59  Text Interpretation: Sinus tachycardia Probable left atrial enlargement Abnormal R-wave progression, early transition Confirmed by Russella Courts ([id number]) on 03/04/2024 5:20:19 PM         Imaging Studies ordered: I ordered imaging studies including ctpe ctap I independently visualized the following imaging with scope of interpretation limited to determining acute life threatening conditions related to emergency care;  findings noted above I independently visualized and interpreted imaging. I agree with the radiologist interpretation   Medicines ordered and prescription drug management: Meds ordered this encounter  Medications   ipratropium-albuterol  (DUONEB) 0.5-2.5 (3) MG/3ML nebulizer solution 3 mL   DISCONTD: sodium chloride  0.9 % bolus 500 mL   HYDROcodone  bit-homatropine (HYCODAN) 5-1.5 MG/5ML syrup 5 mL    Refill:  0   iohexol  (OMNIPAQUE ) 350 MG/ML injection 75 mL   furosemide  (LASIX ) injection 40 mg   cefTRIAXone  (ROCEPHIN ) 1 g in sodium chloride  0.9 % 100 mL IVPB    Antibiotic Indication::   CAP   azithromycin  (ZITHROMAX ) 500 mg in sodium chloride  0.9 % 250 mL IVPB    Antibiotic Indication::   CAP    -I have reviewed the patients home medicines and have made adjustments as needed   Consultations Obtained: I requested consultation with the gen surg,  and discussed lab and imaging findings as well as pertinent plan - they recommend: see tomorrow   Cardiac Monitoring: The patient was maintained on a cardiac monitor.  I personally viewed and interpreted the cardiac monitored which showed an underlying rhythm of: sinus tachy Continuous pulse oximetry interpreted by myself, 98%  on 2L.    Social Determinants of Health:  Diagnosis or treatment significantly limited by social determinants of health: na   Reevaluation: After the interventions noted above, I reevaluated the patient and found that they have improved  Co morbidities that complicate the patient evaluation  Past Medical History:  Diagnosis Date   CP (cerebral palsy) (HCC)       Dispostion: Disposition decision including need for hospitalization was considered, and patient admitted to the hospital.    Final Clinical Impression(s) / ED Diagnoses Final diagnoses:  Pleural effusion  Dyspnea, unspecified type  Hypoxia        Teddi Favors, DO 03/04/24 2124

## 2024-03-04 NOTE — ED Triage Notes (Signed)
 Pt with recent admission for pneumonia and still having a cough and fatigue.

## 2024-03-05 DIAGNOSIS — K5981 Ogilvie syndrome: Secondary | ICD-10-CM | POA: Diagnosis not present

## 2024-03-05 LAB — CBC
HCT: 34.9 % — ABNORMAL LOW (ref 39.0–52.0)
Hemoglobin: 11.1 g/dL — ABNORMAL LOW (ref 13.0–17.0)
MCH: 29.5 pg (ref 26.0–34.0)
MCHC: 31.8 g/dL (ref 30.0–36.0)
MCV: 92.8 fL (ref 80.0–100.0)
Platelets: 412 10*3/uL — ABNORMAL HIGH (ref 150–400)
RBC: 3.76 MIL/uL — ABNORMAL LOW (ref 4.22–5.81)
RDW: 11.9 % (ref 11.5–15.5)
WBC: 4.5 10*3/uL (ref 4.0–10.5)
nRBC: 0 % (ref 0.0–0.2)

## 2024-03-05 LAB — GLUCOSE, CAPILLARY
Glucose-Capillary: 135 mg/dL — ABNORMAL HIGH (ref 70–99)
Glucose-Capillary: 146 mg/dL — ABNORMAL HIGH (ref 70–99)
Glucose-Capillary: 151 mg/dL — ABNORMAL HIGH (ref 70–99)

## 2024-03-05 LAB — BASIC METABOLIC PANEL WITH GFR
Anion gap: 15 (ref 5–15)
BUN: 8 mg/dL (ref 8–23)
CO2: 25 mmol/L (ref 22–32)
Calcium: 9.1 mg/dL (ref 8.9–10.3)
Chloride: 100 mmol/L (ref 98–111)
Creatinine, Ser: 0.97 mg/dL (ref 0.61–1.24)
GFR, Estimated: >60 mL/min (ref >60)
Glucose, Bld: 102 mg/dL — ABNORMAL HIGH (ref 70–99)
Potassium: 3.6 mmol/L (ref 3.5–5.1)
Sodium: 140 mmol/L (ref 135–145)

## 2024-03-05 LAB — PROCALCITONIN: Procalcitonin: <0.1 ng/mL

## 2024-03-05 MED ORDER — GUAIFENESIN-DM 100-10 MG/5ML PO SYRP
5.0000 mL | ORAL_SOLUTION | ORAL | Status: DC | PRN
  Administered 2024-03-05 – 2024-03-07 (×5): 5 mL via ORAL
  Filled 2024-03-05 (×6): qty 5

## 2024-03-05 MED ORDER — ACETAMINOPHEN 325 MG PO TABS
650.0000 mg | ORAL_TABLET | Freq: Four times a day (QID) | ORAL | Status: DC | PRN
  Administered 2024-03-05: 650 mg via ORAL
  Filled 2024-03-05: qty 2

## 2024-03-05 MED ORDER — MILK AND MOLASSES ENEMA
1.0000 | Freq: Once | RECTAL | Status: AC
  Administered 2024-03-05: 240 mL via RECTAL

## 2024-03-05 NOTE — Progress Notes (Signed)
   03/05/24 1815  TOC Brief Assessment  Insurance and Status Reviewed  Patient has primary care physician Yes  Home environment has been reviewed From Home  Prior level of function: Moderate assistance  Prior/Current Home Services No current home services  Readmission risk has been reviewed Yes  Transition of care needs no transition of care needs at this time   Transition of Care Department has reviewed patient and no other TOC needs have been identified at this time we will continue to monitor patient  advancement through interdisciplinary progression rounds. If new patient needs arise please place a TOC consult.

## 2024-03-05 NOTE — Plan of Care (Signed)
  Problem: Education: Goal: Knowledge of General Education information will improve Description: Including pain rating scale, medication(s)/side effects and non-pharmacologic comfort measures Outcome: Progressing   Problem: Clinical Measurements: Goal: Ability to maintain clinical measurements within normal limits will improve Outcome: Progressing Goal: Diagnostic test results will improve Outcome: Progressing Goal: Respiratory complications will improve Outcome: Progressing Goal: Cardiovascular complication will be avoided Outcome: Progressing   Problem: Activity: Goal: Risk for activity intolerance will decrease Outcome: Progressing   

## 2024-03-05 NOTE — Consult Note (Signed)
 Reason for Consult: Pseudoobstruction of colon Referring Physician: Dr. Adolfo Ahr is an 68 y.o. male.  HPI: Patient is a 68 year old white male with a history of cerebral palsy and recent discharge for community-acquired pneumonia who presented back to the hospital with increasing fatigue and cough which did not improve upon discharge.  A CT scan was performed which revealed a new right pleural effusion as well as dilated colon.  In talking with family, no nausea or vomiting has been present.  Denies any significant abdominal pain.  Last bowel movement several days ago?  Patient is a poor historian.  Past Medical History:  Diagnosis Date   CP (cerebral palsy) (HCC)     Past Surgical History:  Procedure Laterality Date   COLONOSCOPY     FLEXIBLE SIGMOIDOSCOPY N/A 05/14/2018   Procedure: FLEXIBLE SIGMOIDOSCOPY;  Surgeon: Alanda Allegra, MD;  Location: [facility];  Service: Gastroenterology;  Laterality: N/A;   none     none    History reviewed. No pertinent family history.  Social History:  reports that he has never smoked. He has never used smokeless tobacco. He reports that he does not drink alcohol and does not use drugs.  Allergies: No Known Allergies  Medications: I have reviewed the patient's current medications. Prior to Admission:  Medications Prior to Admission  Medication Sig Dispense Refill Last Dose/Taking   albuterol  (VENTOLIN  HFA) 108 (90 Base) MCG/ACT inhaler Inhale 2 puffs into the lungs every 4 (four) hours as needed for wheezing or shortness of breath. 18 g 5    benzonatate  (TESSALON ) 200 MG capsule Take 1 capsule (200 mg total) by mouth 2 (two) times daily as needed for cough. 20 capsule 0    BREZTRI AEROSPHERE 160-9-4.8 MCG/ACT AERO Inhale 2 puffs into the lungs in the morning and at bedtime.      busPIRone  (BUSPAR ) 5 MG tablet Take 1 tablet (5 mg total) by mouth 2 (two) times daily. 60 tablet 5    Cholecalciferol (VITAMIN D3) 50 MCG (2000 UT) capsule  Take 2,000 Units by mouth daily.      cyclobenzaprine  (FLEXERIL ) 10 MG tablet Take 10 mg by mouth 3 (three) times daily.      Ergocalciferol (VITAMIN D2 PO) Take 1 tablet by mouth daily.      GAS RELIEF 125 MG CAPS Take by mouth.      GOODSENSE CLEARLAX 17 GM/SCOOP powder Take 17 g by mouth daily.      polyethylene glycol (MIRALAX  / GLYCOLAX ) 17 g packet Take 1 packet by mouth daily.      Vitamin D, Ergocalciferol, 50000 units CAPS Take 1 capsule by mouth once a week.       Results for orders placed or performed during the hospital encounter of 03/04/24 (from the past 48 hours)  CBC with Differential     Status: Abnormal   Collection Time: 03/04/24  2:53 PM  Result Value Ref Range   WBC 6.1 4.0 - 10.5 K/uL   RBC 4.03 (L) 4.22 - 5.81 MIL/uL   Hemoglobin 12.0 (L) 13.0 - 17.0 g/dL   HCT 96.0 (L) 45.4 - 09.8 %   MCV 93.5 80.0 - 100.0 fL   MCH 29.8 26.0 - 34.0 pg   MCHC 31.8 30.0 - 36.0 g/dL   RDW 11.9 14.7 - 82.9 %   Platelets 451 (H) 150 - 400 K/uL   nRBC 0.0 0.0 - 0.2 %   Neutrophils Relative % 55 %   Neutro Abs 3.4  1.7 - 7.7 K/uL   Lymphocytes Relative 26 %   Lymphs Abs 1.6 0.7 - 4.0 K/uL   Monocytes Relative 15 %   Monocytes Absolute 0.9 0.1 - 1.0 K/uL   Eosinophils Relative 3 %   Eosinophils Absolute 0.2 0.0 - 0.5 K/uL   Basophils Relative 1 %   Basophils Absolute 0.0 0.0 - 0.1 K/uL   Immature Granulocytes 0 %   Abs Immature Granulocytes 0.01 0.00 - 0.07 K/uL    Comment: Performed at [facility], [address], [place], [place] [address]  Basic metabolic panel     Status: Abnormal   Collection Time: 03/04/24  2:53 PM  Result Value Ref Range   Sodium 135 135 - 145 mmol/L   Potassium 4.3 3.5 - 5.1 mmol/L   Chloride 103 98 - 111 mmol/L   CO2 23 22 - 32 mmol/L   Glucose, Bld 101 (H) 70 - 99 mg/dL    Comment: Glucose reference range applies only to samples taken after fasting for at least 8 hours.   BUN 10 8 - 23 mg/dL   Creatinine, Ser 6.04 0.61 - 1.24 mg/dL   Calcium 9.1  8.9 - 54.0 mg/dL   GFR, Estimated >98 >11 mL/min    Comment: (NOTE) Calculated using the CKD-EPI Creatinine Equation (2021)    Anion gap 9 5 - 15    Comment: Performed at [facility], [address] [place], [place] [address]  Brain natriuretic peptide     Status: None   Collection Time: 03/04/24  4:04 PM  Result Value Ref Range   B Natriuretic Peptide 54.0 0.0 - 100.0 pg/mL    Comment: Performed at [facility], [address] [place], [place] [address]  Troponin I (High Sensitivity)     Status: None   Collection Time: 03/04/24  4:05 PM  Result Value Ref Range   Troponin I (High Sensitivity) 7 <18 ng/L    Comment: (NOTE) Elevated high sensitivity troponin I (hsTnI) values and significant  changes across serial measurements may suggest ACS but many other  chronic and acute conditions are known to elevate hsTnI results.  Refer to the "Links" section for chest pain algorithms and additional  guidance. Performed at [facility], [address], [place], [place] [address]   Lactic acid, plasma     Status: None   Collection Time: 03/04/24  4:05 PM  Result Value Ref Range   Lactic Acid, Venous 1.3 0.5 - 1.9 mmol/L    Comment: Performed at [facility], [address], [place], [place] [address]  Resp panel by RT-PCR (RSV, Flu A&B, Covid) Anterior Nasal Swab     Status: None   Collection Time: 03/04/24  4:05 PM   Specimen: Anterior Nasal Swab  Result Value Ref Range   SARS Coronavirus 2 by RT PCR NEGATIVE NEGATIVE    Comment: (NOTE) SARS-CoV-2 target nucleic acids are NOT DETECTED.  The SARS-CoV-2 RNA is generally detectable in upper respiratory specimens during the acute phase of infection. The lowest concentration of SARS-CoV-2 viral copies this assay can detect is 138 copies/mL. A negative result does not preclude SARS-Cov-2 infection and should not be used as the sole basis for treatment or other patient management decisions. A negative result may occur with  improper specimen  collection/handling, submission of specimen other than nasopharyngeal swab, presence of viral mutation(s) within the areas targeted by this assay, and inadequate number of viral copies(<138 copies/mL). A negative result must be combined with clinical observations, patient history,  and epidemiological information. The expected result is Negative.  Fact Sheet for Patients:  BloggerCourse.com  Fact Sheet for Healthcare Providers:  SeriousBroker.it  This test is no t yet approved or cleared by the [place]  FDA and  has been authorized for detection and/or diagnosis of SARS-CoV-2 by FDA under an Emergency Use Authorization (EUA). This EUA will remain  in effect (meaning this test can be used) for the duration of the COVID-19 declaration under Section 564(b)(1) of the Act, 21 U.S.C.section 360bbb-3(b)(1), unless the authorization is terminated  or revoked sooner.       Influenza A by PCR NEGATIVE NEGATIVE   Influenza B by PCR NEGATIVE NEGATIVE    Comment: (NOTE) The Xpert Xpress SARS-CoV-2/FLU/RSV plus assay is intended as an aid in the diagnosis of influenza from Nasopharyngeal swab specimens and should not be used as a sole basis for treatment. Nasal washings and aspirates are unacceptable for Xpert Xpress SARS-CoV-2/FLU/RSV testing.  Fact Sheet for Patients: BloggerCourse.com  Fact Sheet for Healthcare Providers: SeriousBroker.it  This test is not yet approved or cleared by the [place]  FDA and has been authorized for detection and/or diagnosis of SARS-CoV-2 by FDA under an Emergency Use Authorization (EUA). This EUA will remain in effect (meaning this test can be used) for the duration of the COVID-19 declaration under Section 564(b)(1) of the Act, 21 U.S.C. section 360bbb-3(b)(1), unless the authorization is terminated or revoked.     Resp Syncytial Virus by PCR  NEGATIVE NEGATIVE    Comment: (NOTE) Fact Sheet for Patients: BloggerCourse.com  Fact Sheet for Healthcare Providers: SeriousBroker.it  This test is not yet approved or cleared by the [place]  FDA and has been authorized for detection and/or diagnosis of SARS-CoV-2 by FDA under an Emergency Use Authorization (EUA). This EUA will remain in effect (meaning this test can be used) for the duration of the COVID-19 declaration under Section 564(b)(1) of the Act, 21 U.S.C. section 360bbb-3(b)(1), unless the authorization is terminated or revoked.  Performed at [facility], [address] [place], [place] [address]   Lactic acid, plasma     Status: None   Collection Time: 03/04/24  5:43 PM  Result Value Ref Range   Lactic Acid, Venous 1.4 0.5 - 1.9 mmol/L    Comment: Performed at [facility], [address] [place], [place] [address]  Troponin I (High Sensitivity)     Status: None   Collection Time: 03/04/24  5:43 PM  Result Value Ref Range   Troponin I (High Sensitivity) 7 <18 ng/L    Comment: (NOTE) Elevated high sensitivity troponin I (hsTnI) values and significant  changes across serial measurements may suggest ACS but many other  chronic and acute conditions are known to elevate hsTnI results.  Refer to the "Links" section for chest pain algorithms and additional  guidance. Performed at [facility], [address] [place], [place] [address]   Urinalysis, Routine w reflex microscopic -Urine, Clean Catch     Status: Abnormal   Collection Time: 03/04/24  9:09 PM  Result Value Ref Range   Color, Urine YELLOW YELLOW   APPearance CLEAR CLEAR   Specific Gravity, Urine >1.046 (H) 1.005 - 1.030   pH 5.0 5.0 - 8.0   Glucose, UA NEGATIVE NEGATIVE mg/dL   Hgb urine dipstick NEGATIVE NEGATIVE   Bilirubin Urine NEGATIVE NEGATIVE   Ketones, ur 20 (A) NEGATIVE mg/dL   Protein, ur NEGATIVE NEGATIVE mg/dL   Nitrite NEGATIVE NEGATIVE    Leukocytes,Ua NEGATIVE NEGATIVE  Comment: Performed at [facility], [address] [place], [place] [address]  Culture, blood (routine x 2) Call MD if unable to obtain prior to antibiotics being given     Status: None (Preliminary result)   Collection Time: 03/04/24 10:53 PM   Specimen: BLOOD RIGHT HAND  Result Value Ref Range   Specimen Description BLOOD RIGHT HAND    Special Requests      BOTTLES DRAWN AEROBIC AND ANAEROBIC Blood Culture adequate volume Performed at [facility], [address] [place], [place] [address]    Culture PENDING    Report Status PENDING   Procalcitonin     Status: None   Collection Time: 03/04/24 10:53 PM  Result Value Ref Range   Procalcitonin <0.10 ng/mL    Comment:        Interpretation: PCT (Procalcitonin) <= 0.5 ng/mL: Systemic infection (sepsis) is not likely. Local bacterial infection is possible. (NOTE)       Sepsis PCT Algorithm           Lower Respiratory Tract                                      Infection PCT Algorithm    ----------------------------     ----------------------------         PCT < 0.25 ng/mL                PCT < 0.10 ng/mL          Strongly encourage             Strongly discourage   discontinuation of antibiotics    initiation of antibiotics    ----------------------------     -----------------------------       PCT 0.25 - 0.50 ng/mL            PCT 0.10 - 0.25 ng/mL               OR       >80% decrease in PCT            Discourage initiation of                                            antibiotics      Encourage discontinuation           of antibiotics    ----------------------------     -----------------------------         PCT >= 0.50 ng/mL              PCT 0.26 - 0.50 ng/mL               AND        <80% decrease in PCT             Encourage initiation of                                             antibiotics       Encourage continuation           of antibiotics    ----------------------------      -----------------------------        PCT >= 0.50 ng/mL  PCT > 0.50 ng/mL               AND         increase in PCT                  Strongly encourage                                      initiation of antibiotics    Strongly encourage escalation           of antibiotics                                     -----------------------------                                           PCT <= 0.25 ng/mL                                                 OR                                        > 80% decrease in PCT                                      Discontinue / Do not initiate                                             antibiotics  Performed at [facility], [address] [place], [place] [address]   Culture, blood (routine x 2) Call MD if unable to obtain prior to antibiotics being given     Status: None (Preliminary result)   Collection Time: 03/04/24 11:00 PM   Specimen: BLOOD RIGHT HAND  Result Value Ref Range   Specimen Description BLOOD RIGHT HAND    Special Requests      BOTTLES DRAWN AEROBIC AND ANAEROBIC Blood Culture adequate volume Performed at [facility], [address], [place], [place] [address]    Culture PENDING    Report Status PENDING   Basic metabolic panel     Status: Abnormal   Collection Time: 03/05/24  5:17 AM  Result Value Ref Range   Sodium 140 135 - 145 mmol/L   Potassium 3.6 3.5 - 5.1 mmol/L   Chloride 100 98 - 111 mmol/L   CO2 25 22 - 32 mmol/L   Glucose, Bld 102 (H) 70 - 99 mg/dL    Comment: Glucose reference range applies only to samples taken after fasting for at least 8 hours.   BUN 8 8 - 23 mg/dL   Creatinine, Ser 1.02 0.61 - 1.24 mg/dL   Calcium 9.1 8.9 - 72.5 mg/dL   GFR, Estimated >36 >64 mL/min    Comment: (NOTE) Calculated using the CKD-EPI  Creatinine Equation (2021)    Anion gap 15 5 - 15    Comment: Performed at [facility], [address] [place], [place] [address]  CBC     Status: Abnormal   Collection Time:  03/05/24  5:17 AM  Result Value Ref Range   WBC 4.5 4.0 - 10.5 K/uL   RBC 3.76 (L) 4.22 - 5.81 MIL/uL   Hemoglobin 11.1 (L) 13.0 - 17.0 g/dL   HCT 52.8 (L) 41.3 - 24.4 %   MCV 92.8 80.0 - 100.0 fL   MCH 29.5 26.0 - 34.0 pg   MCHC 31.8 30.0 - 36.0 g/dL   RDW 01.0 27.2 - 53.6 %   Platelets 412 (H) 150 - 400 K/uL   nRBC 0.0 0.0 - 0.2 %    Comment: Performed at [facility], [address] [place], [place] [address]    CT Angio Chest PE W and/or Wo Contrast Result Date: 03/04/2024 CLINICAL DATA:  Shortness of breath, recent admission for pneumonia. Ongoing cough and fatigue. EXAM: CT ANGIOGRAPHY CHEST CT ABDOMEN AND PELVIS WITH CONTRAST TECHNIQUE: Multidetector CT imaging of the chest was performed using the standard protocol during bolus administration of intravenous contrast. Multiplanar CT image reconstructions and MIPs were obtained to evaluate the vascular anatomy. Multidetector CT imaging of the abdomen and pelvis was performed using the standard protocol during bolus administration of intravenous contrast. RADIATION DOSE REDUCTION: This exam was performed according to the departmental dose-optimization program which includes automated exposure control, adjustment of the mA and/or kV according to patient size and/or use of iterative reconstruction technique. CONTRAST:  75mL OMNIPAQUE  IOHEXOL  350 MG/ML SOLN COMPARISON:  Same day chest radiograph; CT abdomen pelvis 10/10/2022; CT chest 02/22/2024 FINDINGS: CTA CHEST FINDINGS Cardiovascular: Respiratory and cardiac motion limits evaluation. No central pulmonary embolism. The lobar, segmental, and subsegmental pulmonary arteries are obscured by motion. No pericardial effusion. Normal caliber thoracic aorta. Mediastinum/Nodes: Trachea and esophagus are unremarkable. No definite adenopathy. Lungs/Pleura: Large right pleural effusion with near complete atelectasis of the right lower lobe. Subtotal atelectasis of the right middle lobe. Slight decrease in  consolidation in the anterior right upper lobe and sub adjacent right middle lobe (series 6/image [id number]). The left lung is clear. No pneumothorax. Musculoskeletal: No acute fracture. Review of the MIP images confirms the above findings. CT ABDOMEN and PELVIS FINDINGS Hepatobiliary: No acute abnormality. Pancreas: Unremarkable. Spleen: Unremarkable. Adrenals/Urinary Tract: Normal adrenal glands. Cortical geographic hypoattenuation within the left kidney and to a lesser extent in the right kidney. This is favored artifactual due to motion and adjacent streak artifact from dense contrast in the colon. Pyelonephritis could appear similarly. No urinary calculi or hydronephrosis. Unremarkable bladder. Stomach/Bowel: Stomach is within normal limits. Normal caliber small bowel. Dense stool mixed with contrast in the ascending and distal descending/sigmoid colon. Increased marked gaseous distention measuring up to 11.5 cm of the colon in the left upper quadrant it is unclear if this represents redundant sigmoid colon or transverse colon. Vascular/Lymphatic: No significant vascular findings are present. No enlarged abdominal or pelvic lymph nodes. Reproductive: Unremarkable. Other: No free intraperitoneal fluid or air. Left inguinal hernia containing a high riding testicle. Musculoskeletal: No acute fracture. Review of the MIP images confirms the above findings. IMPRESSION: 1. No central pulmonary embolism. The lobar, segmental, and subsegmental pulmonary arteries are obscured by motion. 2. Large right pleural effusion with near complete atelectasis of the right lower lobe and subtotal atelectasis of the right middle lobe. 3. Slight decrease in consolidation in the anterior  right upper lobe and sub adjacent right middle lobe. 4. Follow-up of these findings in 6-8 weeks after treatment is recommended to ensure resolution and exclude underlying mass. 5. Increased marked gaseous distention measuring up to 11.5 cm of the colon in  the left upper quadrant. It is unclear if this represents redundant sigmoid colon or transverse colon. This may represent colonic pseudo-obstruction as there is contrast mixed with stool in the distal sigmoid colon and rectum. 6. Cortical geographic hypoattenuation within the left kidney and to a lesser extent in the right kidney. This is favored artifactual due to motion and adjacent streak artifact from dense contrast in the colon. Pyelonephritis could appear similarly. Correlate with urinalysis. Electronically Signed   By: Rozell Cornet M.D.   On: 03/04/2024 21:05   CT ABDOMEN PELVIS W CONTRAST Result Date: 03/04/2024 CLINICAL DATA:  Shortness of breath, recent admission for pneumonia. Ongoing cough and fatigue. EXAM: CT ANGIOGRAPHY CHEST CT ABDOMEN AND PELVIS WITH CONTRAST TECHNIQUE: Multidetector CT imaging of the chest was performed using the standard protocol during bolus administration of intravenous contrast. Multiplanar CT image reconstructions and MIPs were obtained to evaluate the vascular anatomy. Multidetector CT imaging of the abdomen and pelvis was performed using the standard protocol during bolus administration of intravenous contrast. RADIATION DOSE REDUCTION: This exam was performed according to the departmental dose-optimization program which includes automated exposure control, adjustment of the mA and/or kV according to patient size and/or use of iterative reconstruction technique. CONTRAST:  75mL OMNIPAQUE  IOHEXOL  350 MG/ML SOLN COMPARISON:  Same day chest radiograph; CT abdomen pelvis 10/10/2022; CT chest 02/22/2024 FINDINGS: CTA CHEST FINDINGS Cardiovascular: Respiratory and cardiac motion limits evaluation. No central pulmonary embolism. The lobar, segmental, and subsegmental pulmonary arteries are obscured by motion. No pericardial effusion. Normal caliber thoracic aorta. Mediastinum/Nodes: Trachea and esophagus are unremarkable. No definite adenopathy. Lungs/Pleura: Large right  pleural effusion with near complete atelectasis of the right lower lobe. Subtotal atelectasis of the right middle lobe. Slight decrease in consolidation in the anterior right upper lobe and sub adjacent right middle lobe (series 6/image 69-75). The left lung is clear. No pneumothorax. Musculoskeletal: No acute fracture. Review of the MIP images confirms the above findings. CT ABDOMEN and PELVIS FINDINGS Hepatobiliary: No acute abnormality. Pancreas: Unremarkable. Spleen: Unremarkable. Adrenals/Urinary Tract: Normal adrenal glands. Cortical geographic hypoattenuation within the left kidney and to a lesser extent in the right kidney. This is favored artifactual due to motion and adjacent streak artifact from dense contrast in the colon. Pyelonephritis could appear similarly. No urinary calculi or hydronephrosis. Unremarkable bladder. Stomach/Bowel: Stomach is within normal limits. Normal caliber small bowel. Dense stool mixed with contrast in the ascending and distal descending/sigmoid colon. Increased marked gaseous distention measuring up to 11.5 cm of the colon in the left upper quadrant it is unclear if this represents redundant sigmoid colon or transverse colon. Vascular/Lymphatic: No significant vascular findings are present. No enlarged abdominal or pelvic lymph nodes. Reproductive: Unremarkable. Other: No free intraperitoneal fluid or air. Left inguinal hernia containing a high riding testicle. Musculoskeletal: No acute fracture. Review of the MIP images confirms the above findings. IMPRESSION: 1. No central pulmonary embolism. The lobar, segmental, and subsegmental pulmonary arteries are obscured by motion. 2. Large right pleural effusion with near complete atelectasis of the right lower lobe and subtotal atelectasis of the right middle lobe. 3. Slight decrease in consolidation in the anterior right upper lobe and sub adjacent right middle lobe. 4. Follow-up of these findings in 6-8 weeks after treatment  is  recommended to ensure resolution and exclude underlying mass. 5. Increased marked gaseous distention measuring up to 11.5 cm of the colon in the left upper quadrant. It is unclear if this represents redundant sigmoid colon or transverse colon. This may represent colonic pseudo-obstruction as there is contrast mixed with stool in the distal sigmoid colon and rectum. 6. Cortical geographic hypoattenuation within the left kidney and to a lesser extent in the right kidney. This is favored artifactual due to motion and adjacent streak artifact from dense contrast in the colon. Pyelonephritis could appear similarly. Correlate with urinalysis. Electronically Signed   By: Rozell Cornet M.D.   On: 03/04/2024 21:05   DG Chest 2 View Result Date: 03/04/2024 CLINICAL DATA:  Cough and fatigue EXAM: CHEST - 2 VIEW COMPARISON:  February 22, 2024 FINDINGS: No cardiomegaly. Tortuous aorta. Small to moderate right pleural effusion, slightly increased in the interim. Compressive atelectasis in the right lung base. No pneumothorax. Gaseous distension of the colon in the left upper quadrant. IMPRESSION: Slightly increased size of the small to moderate right pleural effusion. Electronically Signed   By: Rance Burrows M.D.   On: 03/04/2024 17:34    ROS:  Pertinent items are noted in HPI.  Blood pressure (!) 109/91, pulse (!) 102, temperature 98.9 F (37.2 C), temperature source Oral, resp. rate 18, height 5\' 5"  (1.651 m), weight 71.4 kg, SpO2 100%. Physical Exam: Pleasant black male in no acute distress Lungs with decreased breath sounds on right side. Heart examination reveals a regular rate and rhythm without S3, S4, murmurs Abdomen is soft with only slight distention.  Minimal bowel sounds appreciated.  Nontender.  CT scan images personally reviewed.  Of note is the fact that the patient does have contrast in the sigmoid colon and rectum with a stool burden in the ascending colon.  Assessment/Plan: Impression:  Pseudoobstruction of the colon most likely secondary to core mobilities including cerebral palsy and pneumonia.  No significant dilatation of the cecum. Plan: Will give milk of molasses enema.  No need for acute surgical invention at the present time.  Will follow with you.  Alanda Allegra 03/05/2024, 11:03 AM

## 2024-03-05 NOTE — Progress Notes (Signed)
   03/05/24 1327  Assess: MEWS Score  Temp 99.9 F (37.7 C)  BP 129/68  MAP (mmHg) 86  Pulse Rate (!) 108  Resp (!) 28  SpO2 95 %  O2 Device Nasal Cannula  O2 Flow Rate (L/min) 2 L/min  Assess: MEWS Score  MEWS Temp 0  MEWS Systolic 0  MEWS Pulse 1  MEWS RR 2  MEWS LOC 0  MEWS Score 3  MEWS Score Color Yellow  Assess: if the MEWS score is Yellow or Red  Were vital signs accurate and taken at a resting state? Yes  Does the patient meet 2 or more of the SIRS criteria? Yes  Does the patient have a confirmed or suspected source of infection? Yes  Notify: Charge Nurse/RN  Name of Charge Nurse/RN Notified nancy  Provider Notification  Provider Name/Title Pratik shah  Date Provider Notified 03/05/24  Time Provider Notified 1347  Method of Notification Page  Assess: SIRS CRITERIA  SIRS Temperature  0  SIRS Respirations  1  SIRS Pulse 1  SIRS WBC 0  SIRS Score Sum  2

## 2024-03-05 NOTE — Progress Notes (Signed)
 PROGRESS NOTE    Peter Levy  HQI:[id number] DOB: 1956/06/24 DOA: 03/04/2024 PCP: Minus Amel, MD   Brief Narrative:    Peter Levy is a 68 y.o. male with medical history significant of cerebral palsy, recent admission for community-acquired pneumonia. He was treated and discharged to home. He returns due to increasing fatigue, DOE, cough that is not improving despite completion of antibiotics.  He is now noted to have worsening hypoxemia in the setting of possible ongoing pneumonia along with large right pleural effusion.  Plans are for thoracentesis on 4/21.  He is also noted to have colonic pseudoobstruction with plans for enema today.  Assessment & Plan:   Principal Problem:   Pseudo-obstruction of colon Active Problems:   Cerebral palsy (HCC)   Multifocal pneumonia   Pleural effusion  Assessment and Plan:   Hypoxia with multifocal pneumonia Plan to discontinue IV antibiotics Blood cultures currently pending Procalcitonin is low Plan for thoracentesis on Monday 4/21 CBC in the morning Pleural effusion Thoracentesis needed on 4/21 Cerebral palsy Pseudoobstruction of colon related to cerebral palsy MiraLAX  as needed General Surgery planning for molasses enema   DVT prophylaxis:Lovenox  Code Status: Full Family Communication: Caretaker from group home on phone 4/19 Disposition Plan:  Status is: Inpatient Remains inpatient appropriate because: Need for IV antibiotics.  Consultants:  None  Procedures:  None  Antimicrobials:  Anti-infectives (From admission, onward)    Start     Dose/Rate Route Frequency Ordered Stop   03/04/24 2200  levofloxacin  (LEVAQUIN ) IVPB 750 mg        750 mg 100 mL/hr over 90 Minutes Intravenous Every 24 hours 03/04/24 2149 03/09/24 2159   03/04/24 2130  cefTRIAXone  (ROCEPHIN ) 1 g in sodium chloride  0.9 % 100 mL IVPB        1 g 200 mL/hr over 30 Minutes Intravenous  Once 03/04/24 2124 03/04/24 2349   03/04/24 2130  azithromycin   (ZITHROMAX ) 500 mg in sodium chloride  0.9 % 250 mL IVPB  Status:  Discontinued        500 mg 250 mL/hr over 60 Minutes Intravenous  Once 03/04/24 2124 03/04/24 2153      Subjective: Patient seen and evaluated today with no new acute complaints or concerns. No acute concerns or events noted overnight.  Objective: Vitals:   03/04/24 2100 03/04/24 2148 03/04/24 2230 03/05/24 0248  BP: (!) 134/123  128/74 136/70  Pulse: (!) 101  95 (!) 107  Resp: (!) 27  19 19   Temp: 99 F (37.2 C)  97.9 F (36.6 C) 98.4 F (36.9 C)  TempSrc:   Oral Oral  SpO2: 98% 96% 96% 99%  Weight:      Height:        Intake/Output Summary (Last 24 hours) at 03/05/2024 0723 Last data filed at 03/05/2024 0326 Gross per 24 hour  Intake 480 ml  Output 1500 ml  Net -1020 ml   Filed Weights   03/04/24 1433  Weight: 71.4 kg    Examination:  General exam: Appears calm and comfortable  Respiratory system: Clear to auscultation. Respiratory effort normal. 2L [place] Cardiovascular system: S1 & S2 heard, RRR.  Gastrointestinal system: Abdomen is soft Central nervous system: Alert and awake Extremities: No edema Skin: No significant lesions noted Psychiatry: Flat affect.    Data Reviewed: I have personally reviewed following labs and imaging studies  CBC: Recent Labs  Lab 03/04/24 1453 03/05/24 0517  WBC 6.1 4.5  NEUTROABS 3.4  --   HGB 12.0* 11.1*  HCT 37.7* 34.9*  MCV 93.5 92.8  PLT 451* 412*   Basic Metabolic Panel: Recent Labs  Lab 03/04/24 1453 03/05/24 0517  NA 135 140  K 4.3 3.6  CL 103 100  CO2 23 25  GLUCOSE 101* 102*  BUN 10 8  CREATININE 0.91 0.97  CALCIUM 9.1 9.1   GFR: Estimated Creatinine Clearance: 63.4 mL/min (by C-G formula based on SCr of 0.97 mg/dL). Liver Function Tests: No results for input(s): "AST", "ALT", "ALKPHOS", "BILITOT", "PROT", "ALBUMIN" in the last 168 hours. No results for input(s): "LIPASE", "AMYLASE" in the last 168 hours. No results for input(s):  "AMMONIA" in the last 168 hours. Coagulation Profile: No results for input(s): "INR", "PROTIME" in the last 168 hours. Cardiac Enzymes: No results for input(s): "CKTOTAL", "CKMB", "CKMBINDEX", "TROPONINI" in the last 168 hours. BNP (last 3 results) No results for input(s): "PROBNP" in the last 8760 hours. HbA1C: No results for input(s): "HGBA1C" in the last 72 hours. CBG: No results for input(s): "GLUCAP" in the last 168 hours. Lipid Profile: No results for input(s): "CHOL", "HDL", "LDLCALC", "TRIG", "CHOLHDL", "LDLDIRECT" in the last 72 hours. Thyroid  Function Tests: No results for input(s): "TSH", "T4TOTAL", "FREET4", "T3FREE", "THYROIDAB" in the last 72 hours. Anemia Panel: No results for input(s): "VITAMINB12", "FOLATE", "FERRITIN", "TIBC", "IRON", "RETICCTPCT" in the last 72 hours. Sepsis Labs: Recent Labs  Lab 03/04/24 1605 03/04/24 1743 03/04/24 2253  PROCALCITON  --   --  <0.10  LATICACIDVEN 1.3 1.4  --     Recent Results (from the past 240 hours)  Resp panel by RT-PCR (RSV, Flu A&B, Covid) Anterior Nasal Swab     Status: None   Collection Time: 03/04/24  4:05 PM   Specimen: Anterior Nasal Swab  Result Value Ref Range Status   SARS Coronavirus 2 by RT PCR NEGATIVE NEGATIVE Final    Comment: (NOTE) SARS-CoV-2 target nucleic acids are NOT DETECTED.  The SARS-CoV-2 RNA is generally detectable in upper respiratory specimens during the acute phase of infection. The lowest concentration of SARS-CoV-2 viral copies this assay can detect is 138 copies/mL. A negative result does not preclude SARS-Cov-2 infection and should not be used as the sole basis for treatment or other patient management decisions. A negative result may occur with  improper specimen collection/handling, submission of specimen other than nasopharyngeal swab, presence of viral mutation(s) within the areas targeted by this assay, and inadequate number of viral copies(<138 copies/mL). A negative result  must be combined with clinical observations, patient history, and epidemiological information. The expected result is Negative.  Fact Sheet for Patients:  BloggerCourse.com  Fact Sheet for Healthcare Providers:  SeriousBroker.it  This test is no t yet approved or cleared by the [place]  FDA and  has been authorized for detection and/or diagnosis of SARS-CoV-2 by FDA under an Emergency Use Authorization (EUA). This EUA will remain  in effect (meaning this test can be used) for the duration of the COVID-19 declaration under Section 564(b)(1) of the Act, 21 U.S.C.section 360bbb-3(b)(1), unless the authorization is terminated  or revoked sooner.       Influenza A by PCR NEGATIVE NEGATIVE Final   Influenza B by PCR NEGATIVE NEGATIVE Final    Comment: (NOTE) The Xpert Xpress SARS-CoV-2/FLU/RSV plus assay is intended as an aid in the diagnosis of influenza from Nasopharyngeal swab specimens and should not be used as a sole basis for treatment. Nasal washings and aspirates are unacceptable for Xpert Xpress SARS-CoV-2/FLU/RSV testing.  Fact Sheet for Patients: BloggerCourse.com  Fact Sheet for Healthcare Providers: SeriousBroker.it  This test is not yet approved or cleared by the [place]  FDA and has been authorized for detection and/or diagnosis of SARS-CoV-2 by FDA under an Emergency Use Authorization (EUA). This EUA will remain in effect (meaning this test can be used) for the duration of the COVID-19 declaration under Section 564(b)(1) of the Act, 21 U.S.C. section 360bbb-3(b)(1), unless the authorization is terminated or revoked.     Resp Syncytial Virus by PCR NEGATIVE NEGATIVE Final    Comment: (NOTE) Fact Sheet for Patients: BloggerCourse.com  Fact Sheet for Healthcare Providers: SeriousBroker.it  This test is  not yet approved or cleared by the [place]  FDA and has been authorized for detection and/or diagnosis of SARS-CoV-2 by FDA under an Emergency Use Authorization (EUA). This EUA will remain in effect (meaning this test can be used) for the duration of the COVID-19 declaration under Section 564(b)(1) of the Act, 21 U.S.C. section 360bbb-3(b)(1), unless the authorization is terminated or revoked.  Performed at [facility], [address], [place], [place] [address]   Culture, blood (routine x 2) Call MD if unable to obtain prior to antibiotics being given     Status: None (Preliminary result)   Collection Time: 03/04/24 10:53 PM   Specimen: BLOOD RIGHT HAND  Result Value Ref Range Status   Specimen Description BLOOD RIGHT HAND  Final   Special Requests   Final    BOTTLES DRAWN AEROBIC AND ANAEROBIC Blood Culture adequate volume Performed at [facility], [address] [place], [place] [address]    Culture PENDING  Incomplete   Report Status PENDING  Incomplete  Culture, blood (routine x 2) Call MD if unable to obtain prior to antibiotics being given     Status: None (Preliminary result)   Collection Time: 03/04/24 11:00 PM   Specimen: BLOOD RIGHT HAND  Result Value Ref Range Status   Specimen Description BLOOD RIGHT HAND  Final   Special Requests   Final    BOTTLES DRAWN AEROBIC AND ANAEROBIC Blood Culture adequate volume Performed at [facility], [address] [place], [place] [address]    Culture PENDING  Incomplete   Report Status PENDING  Incomplete         Radiology Studies: CT Angio Chest PE W and/or Wo Contrast Result Date: 03/04/2024 CLINICAL DATA:  Shortness of breath, recent admission for pneumonia. Ongoing cough and fatigue. EXAM: CT ANGIOGRAPHY CHEST CT ABDOMEN AND PELVIS WITH CONTRAST TECHNIQUE: Multidetector CT imaging of the chest was performed using the standard protocol during bolus administration of intravenous contrast. Multiplanar CT image  reconstructions and MIPs were obtained to evaluate the vascular anatomy. Multidetector CT imaging of the abdomen and pelvis was performed using the standard protocol during bolus administration of intravenous contrast. RADIATION DOSE REDUCTION: This exam was performed according to the departmental dose-optimization program which includes automated exposure control, adjustment of the mA and/or kV according to patient size and/or use of iterative reconstruction technique. CONTRAST:  75mL OMNIPAQUE  IOHEXOL  350 MG/ML SOLN COMPARISON:  Same day chest radiograph; CT abdomen pelvis 10/10/2022; CT chest 02/22/2024 FINDINGS: CTA CHEST FINDINGS Cardiovascular: Respiratory and cardiac motion limits evaluation. No central pulmonary embolism. The lobar, segmental, and subsegmental pulmonary arteries are obscured by motion. No pericardial effusion. Normal caliber thoracic aorta. Mediastinum/Nodes: Trachea and esophagus are unremarkable. No definite adenopathy. Lungs/Pleura: Large right pleural effusion with near complete atelectasis of the right lower lobe. Subtotal atelectasis of the right middle lobe. Slight decrease in consolidation in  the anterior right upper lobe and sub adjacent right middle lobe (series 6/image 69-75). The left lung is clear. No pneumothorax. Musculoskeletal: No acute fracture. Review of the MIP images confirms the above findings. CT ABDOMEN and PELVIS FINDINGS Hepatobiliary: No acute abnormality. Pancreas: Unremarkable. Spleen: Unremarkable. Adrenals/Urinary Tract: Normal adrenal glands. Cortical geographic hypoattenuation within the left kidney and to a lesser extent in the right kidney. This is favored artifactual due to motion and adjacent streak artifact from dense contrast in the colon. Pyelonephritis could appear similarly. No urinary calculi or hydronephrosis. Unremarkable bladder. Stomach/Bowel: Stomach is within normal limits. Normal caliber small bowel. Dense stool mixed with contrast in the  ascending and distal descending/sigmoid colon. Increased marked gaseous distention measuring up to 11.5 cm of the colon in the left upper quadrant it is unclear if this represents redundant sigmoid colon or transverse colon. Vascular/Lymphatic: No significant vascular findings are present. No enlarged abdominal or pelvic lymph nodes. Reproductive: Unremarkable. Other: No free intraperitoneal fluid or air. Left inguinal hernia containing a high riding testicle. Musculoskeletal: No acute fracture. Review of the MIP images confirms the above findings. IMPRESSION: 1. No central pulmonary embolism. The lobar, segmental, and subsegmental pulmonary arteries are obscured by motion. 2. Large right pleural effusion with near complete atelectasis of the right lower lobe and subtotal atelectasis of the right middle lobe. 3. Slight decrease in consolidation in the anterior right upper lobe and sub adjacent right middle lobe. 4. Follow-up of these findings in 6-8 weeks after treatment is recommended to ensure resolution and exclude underlying mass. 5. Increased marked gaseous distention measuring up to 11.5 cm of the colon in the left upper quadrant. It is unclear if this represents redundant sigmoid colon or transverse colon. This may represent colonic pseudo-obstruction as there is contrast mixed with stool in the distal sigmoid colon and rectum. 6. Cortical geographic hypoattenuation within the left kidney and to a lesser extent in the right kidney. This is favored artifactual due to motion and adjacent streak artifact from dense contrast in the colon. Pyelonephritis could appear similarly. Correlate with urinalysis. Electronically Signed   By: Rozell Cornet M.D.   On: 03/04/2024 21:05   CT ABDOMEN PELVIS W CONTRAST Result Date: 03/04/2024 CLINICAL DATA:  Shortness of breath, recent admission for pneumonia. Ongoing cough and fatigue. EXAM: CT ANGIOGRAPHY CHEST CT ABDOMEN AND PELVIS WITH CONTRAST TECHNIQUE: Multidetector  CT imaging of the chest was performed using the standard protocol during bolus administration of intravenous contrast. Multiplanar CT image reconstructions and MIPs were obtained to evaluate the vascular anatomy. Multidetector CT imaging of the abdomen and pelvis was performed using the standard protocol during bolus administration of intravenous contrast. RADIATION DOSE REDUCTION: This exam was performed according to the departmental dose-optimization program which includes automated exposure control, adjustment of the mA and/or kV according to patient size and/or use of iterative reconstruction technique. CONTRAST:  75mL OMNIPAQUE  IOHEXOL  350 MG/ML SOLN COMPARISON:  Same day chest radiograph; CT abdomen pelvis 10/10/2022; CT chest 02/22/2024 FINDINGS: CTA CHEST FINDINGS Cardiovascular: Respiratory and cardiac motion limits evaluation. No central pulmonary embolism. The lobar, segmental, and subsegmental pulmonary arteries are obscured by motion. No pericardial effusion. Normal caliber thoracic aorta. Mediastinum/Nodes: Trachea and esophagus are unremarkable. No definite adenopathy. Lungs/Pleura: Large right pleural effusion with near complete atelectasis of the right lower lobe. Subtotal atelectasis of the right middle lobe. Slight decrease in consolidation in the anterior right upper lobe and sub adjacent right middle lobe (series 6/image 69-75). The left lung is clear.  No pneumothorax. Musculoskeletal: No acute fracture. Review of the MIP images confirms the above findings. CT ABDOMEN and PELVIS FINDINGS Hepatobiliary: No acute abnormality. Pancreas: Unremarkable. Spleen: Unremarkable. Adrenals/Urinary Tract: Normal adrenal glands. Cortical geographic hypoattenuation within the left kidney and to a lesser extent in the right kidney. This is favored artifactual due to motion and adjacent streak artifact from dense contrast in the colon. Pyelonephritis could appear similarly. No urinary calculi or hydronephrosis.  Unremarkable bladder. Stomach/Bowel: Stomach is within normal limits. Normal caliber small bowel. Dense stool mixed with contrast in the ascending and distal descending/sigmoid colon. Increased marked gaseous distention measuring up to 11.5 cm of the colon in the left upper quadrant it is unclear if this represents redundant sigmoid colon or transverse colon. Vascular/Lymphatic: No significant vascular findings are present. No enlarged abdominal or pelvic lymph nodes. Reproductive: Unremarkable. Other: No free intraperitoneal fluid or air. Left inguinal hernia containing a high riding testicle. Musculoskeletal: No acute fracture. Review of the MIP images confirms the above findings. IMPRESSION: 1. No central pulmonary embolism. The lobar, segmental, and subsegmental pulmonary arteries are obscured by motion. 2. Large right pleural effusion with near complete atelectasis of the right lower lobe and subtotal atelectasis of the right middle lobe. 3. Slight decrease in consolidation in the anterior right upper lobe and sub adjacent right middle lobe. 4. Follow-up of these findings in 6-8 weeks after treatment is recommended to ensure resolution and exclude underlying mass. 5. Increased marked gaseous distention measuring up to 11.5 cm of the colon in the left upper quadrant. It is unclear if this represents redundant sigmoid colon or transverse colon. This may represent colonic pseudo-obstruction as there is contrast mixed with stool in the distal sigmoid colon and rectum. 6. Cortical geographic hypoattenuation within the left kidney and to a lesser extent in the right kidney. This is favored artifactual due to motion and adjacent streak artifact from dense contrast in the colon. Pyelonephritis could appear similarly. Correlate with urinalysis. Electronically Signed   By: Rozell Cornet M.D.   On: 03/04/2024 21:05   DG Chest 2 View Result Date: 03/04/2024 CLINICAL DATA:  Cough and fatigue EXAM: CHEST - 2 VIEW  COMPARISON:  February 22, 2024 FINDINGS: No cardiomegaly. Tortuous aorta. Small to moderate right pleural effusion, slightly increased in the interim. Compressive atelectasis in the right lung base. No pneumothorax. Gaseous distension of the colon in the left upper quadrant. IMPRESSION: Slightly increased size of the small to moderate right pleural effusion. Electronically Signed   By: Rance Burrows M.D.   On: 03/04/2024 17:34        Scheduled Meds:  enoxaparin  (LOVENOX ) injection  40 mg Subcutaneous Q24H   Continuous Infusions:  levofloxacin  (LEVAQUIN ) IV 750 mg (03/05/24 0030)     LOS: 1 day    Time spent: 55 minutes    Yanel Dombrosky D Mason Sole, DO [facility]  If 7PM-7AM, please contact night-coverage [email or web address] 03/05/2024, 7:23 AM

## 2024-03-06 DIAGNOSIS — K5981 Ogilvie syndrome: Secondary | ICD-10-CM | POA: Diagnosis not present

## 2024-03-06 LAB — CBC
HCT: 35.8 % — ABNORMAL LOW (ref 39.0–52.0)
Hemoglobin: 11.2 g/dL — ABNORMAL LOW (ref 13.0–17.0)
MCH: 29.6 pg (ref 26.0–34.0)
MCHC: 31.3 g/dL (ref 30.0–36.0)
MCV: 94.7 fL (ref 80.0–100.0)
Platelets: 452 10*3/uL — ABNORMAL HIGH (ref 150–400)
RBC: 3.78 MIL/uL — ABNORMAL LOW (ref 4.22–5.81)
RDW: 12 % (ref 11.5–15.5)
WBC: 4.8 10*3/uL (ref 4.0–10.5)
nRBC: 0 % (ref 0.0–0.2)

## 2024-03-06 LAB — GLUCOSE, CAPILLARY
Glucose-Capillary: 110 mg/dL — ABNORMAL HIGH (ref 70–99)
Glucose-Capillary: 118 mg/dL — ABNORMAL HIGH (ref 70–99)
Glucose-Capillary: 97 mg/dL (ref 70–99)

## 2024-03-06 LAB — BASIC METABOLIC PANEL WITH GFR
Anion gap: 10 (ref 5–15)
BUN: 15 mg/dL (ref 8–23)
CO2: 26 mmol/L (ref 22–32)
Calcium: 9 mg/dL (ref 8.9–10.3)
Chloride: 102 mmol/L (ref 98–111)
Creatinine, Ser: 0.91 mg/dL (ref 0.61–1.24)
GFR, Estimated: >60 mL/min (ref >60)
Glucose, Bld: 114 mg/dL — ABNORMAL HIGH (ref 70–99)
Potassium: 3.9 mmol/L (ref 3.5–5.1)
Sodium: 138 mmol/L (ref 135–145)

## 2024-03-06 LAB — MAGNESIUM: Magnesium: 2.2 mg/dL (ref 1.7–2.4)

## 2024-03-06 MED ORDER — BISACODYL 10 MG RE SUPP
10.0000 mg | Freq: Two times a day (BID) | RECTAL | Status: DC
  Administered 2024-03-06 – 2024-03-07 (×2): 10 mg via RECTAL
  Filled 2024-03-06 (×2): qty 1

## 2024-03-06 NOTE — Progress Notes (Signed)
 Subjective: Patient denies any abdominal pain.  He does not remember whether he had a bowel movement after his enema.  Objective: Vital signs in last 24 hours: Temp:  [97.7 F (36.5 C)-99.9 F (37.7 C)] 98.9 F (37.2 C) (04/20 0900) Pulse Rate:  [91-123] 91 (04/20 0900) Resp:  [20-34] 20 (04/20 0900) BP: (107-135)/(68-91) 122/70 (04/20 0900) SpO2:  [95 %-100 %] 100 % (04/20 0900) Last BM Date : 03/05/24  Intake/Output from previous day: 04/19 0701 - 04/20 0700 In: 1080 [P.O.:1080] Out: 150 [Urine:150] Intake/Output this shift: Total I/O In: 240 [P.O.:240] Out: -   General appearance: alert, cooperative, and no distress GI: Soft but more distended today.  Not tense.  Occasional bowel sounds appreciated.  Lab Results:  Recent Labs    03/05/24 0517 03/06/24 0432  WBC 4.5 4.8  HGB 11.1* 11.2*  HCT 34.9* 35.8*  PLT 412* 452*   BMET Recent Labs    03/05/24 0517 03/06/24 0432  NA 140 138  K 3.6 3.9  CL 100 102  CO2 25 26  GLUCOSE 102* 114*  BUN 8 15  CREATININE 0.97 0.91  CALCIUM 9.1 9.0   PT/INR No results for input(s): "LABPROT", "INR" in the last 72 hours.  Studies/Results: CT Angio Chest PE W and/or Wo Contrast Result Date: 03/04/2024 CLINICAL DATA:  Shortness of breath, recent admission for pneumonia. Ongoing cough and fatigue. EXAM: CT ANGIOGRAPHY CHEST CT ABDOMEN AND PELVIS WITH CONTRAST TECHNIQUE: Multidetector CT imaging of the chest was performed using the standard protocol during bolus administration of intravenous contrast. Multiplanar CT image reconstructions and MIPs were obtained to evaluate the vascular anatomy. Multidetector CT imaging of the abdomen and pelvis was performed using the standard protocol during bolus administration of intravenous contrast. RADIATION DOSE REDUCTION: This exam was performed according to the departmental dose-optimization program which includes automated exposure control, adjustment of the mA and/or kV according to  patient size and/or use of iterative reconstruction technique. CONTRAST:  75mL OMNIPAQUE  IOHEXOL  350 MG/ML SOLN COMPARISON:  Same day chest radiograph; CT abdomen pelvis 10/10/2022; CT chest 02/22/2024 FINDINGS: CTA CHEST FINDINGS Cardiovascular: Respiratory and cardiac motion limits evaluation. No central pulmonary embolism. The lobar, segmental, and subsegmental pulmonary arteries are obscured by motion. No pericardial effusion. Normal caliber thoracic aorta. Mediastinum/Nodes: Trachea and esophagus are unremarkable. No definite adenopathy. Lungs/Pleura: Large right pleural effusion with near complete atelectasis of the right lower lobe. Subtotal atelectasis of the right middle lobe. Slight decrease in consolidation in the anterior right upper lobe and sub adjacent right middle lobe (series 6/image 69-75). The left lung is clear. No pneumothorax. Musculoskeletal: No acute fracture. Review of the MIP images confirms the above findings. CT ABDOMEN and PELVIS FINDINGS Hepatobiliary: No acute abnormality. Pancreas: Unremarkable. Spleen: Unremarkable. Adrenals/Urinary Tract: Normal adrenal glands. Cortical geographic hypoattenuation within the left kidney and to a lesser extent in the right kidney. This is favored artifactual due to motion and adjacent streak artifact from dense contrast in the colon. Pyelonephritis could appear similarly. No urinary calculi or hydronephrosis. Unremarkable bladder. Stomach/Bowel: Stomach is within normal limits. Normal caliber small bowel. Dense stool mixed with contrast in the ascending and distal descending/sigmoid colon. Increased marked gaseous distention measuring up to 11.5 cm of the colon in the left upper quadrant it is unclear if this represents redundant sigmoid colon or transverse colon. Vascular/Lymphatic: No significant vascular findings are present. No enlarged abdominal or pelvic lymph nodes. Reproductive: Unremarkable. Other: No free intraperitoneal fluid or air. Left  inguinal hernia containing a  high riding testicle. Musculoskeletal: No acute fracture. Review of the MIP images confirms the above findings. IMPRESSION: 1. No central pulmonary embolism. The lobar, segmental, and subsegmental pulmonary arteries are obscured by motion. 2. Large right pleural effusion with near complete atelectasis of the right lower lobe and subtotal atelectasis of the right middle lobe. 3. Slight decrease in consolidation in the anterior right upper lobe and sub adjacent right middle lobe. 4. Follow-up of these findings in 6-8 weeks after treatment is recommended to ensure resolution and exclude underlying mass. 5. Increased marked gaseous distention measuring up to 11.5 cm of the colon in the left upper quadrant. It is unclear if this represents redundant sigmoid colon or transverse colon. This may represent colonic pseudo-obstruction as there is contrast mixed with stool in the distal sigmoid colon and rectum. 6. Cortical geographic hypoattenuation within the left kidney and to a lesser extent in the right kidney. This is favored artifactual due to motion and adjacent streak artifact from dense contrast in the colon. Pyelonephritis could appear similarly. Correlate with urinalysis. Electronically Signed   By: Rozell Cornet M.D.   On: 03/04/2024 21:05   CT ABDOMEN PELVIS W CONTRAST Result Date: 03/04/2024 CLINICAL DATA:  Shortness of breath, recent admission for pneumonia. Ongoing cough and fatigue. EXAM: CT ANGIOGRAPHY CHEST CT ABDOMEN AND PELVIS WITH CONTRAST TECHNIQUE: Multidetector CT imaging of the chest was performed using the standard protocol during bolus administration of intravenous contrast. Multiplanar CT image reconstructions and MIPs were obtained to evaluate the vascular anatomy. Multidetector CT imaging of the abdomen and pelvis was performed using the standard protocol during bolus administration of intravenous contrast. RADIATION DOSE REDUCTION: This exam was performed  according to the departmental dose-optimization program which includes automated exposure control, adjustment of the mA and/or kV according to patient size and/or use of iterative reconstruction technique. CONTRAST:  75mL OMNIPAQUE  IOHEXOL  350 MG/ML SOLN COMPARISON:  Same day chest radiograph; CT abdomen pelvis 10/10/2022; CT chest 02/22/2024 FINDINGS: CTA CHEST FINDINGS Cardiovascular: Respiratory and cardiac motion limits evaluation. No central pulmonary embolism. The lobar, segmental, and subsegmental pulmonary arteries are obscured by motion. No pericardial effusion. Normal caliber thoracic aorta. Mediastinum/Nodes: Trachea and esophagus are unremarkable. No definite adenopathy. Lungs/Pleura: Large right pleural effusion with near complete atelectasis of the right lower lobe. Subtotal atelectasis of the right middle lobe. Slight decrease in consolidation in the anterior right upper lobe and sub adjacent right middle lobe (series 6/image 69-75). The left lung is clear. No pneumothorax. Musculoskeletal: No acute fracture. Review of the MIP images confirms the above findings. CT ABDOMEN and PELVIS FINDINGS Hepatobiliary: No acute abnormality. Pancreas: Unremarkable. Spleen: Unremarkable. Adrenals/Urinary Tract: Normal adrenal glands. Cortical geographic hypoattenuation within the left kidney and to a lesser extent in the right kidney. This is favored artifactual due to motion and adjacent streak artifact from dense contrast in the colon. Pyelonephritis could appear similarly. No urinary calculi or hydronephrosis. Unremarkable bladder. Stomach/Bowel: Stomach is within normal limits. Normal caliber small bowel. Dense stool mixed with contrast in the ascending and distal descending/sigmoid colon. Increased marked gaseous distention measuring up to 11.5 cm of the colon in the left upper quadrant it is unclear if this represents redundant sigmoid colon or transverse colon. Vascular/Lymphatic: No significant vascular  findings are present. No enlarged abdominal or pelvic lymph nodes. Reproductive: Unremarkable. Other: No free intraperitoneal fluid or air. Left inguinal hernia containing a high riding testicle. Musculoskeletal: No acute fracture. Review of the MIP images confirms the above findings. IMPRESSION: 1. No  central pulmonary embolism. The lobar, segmental, and subsegmental pulmonary arteries are obscured by motion. 2. Large right pleural effusion with near complete atelectasis of the right lower lobe and subtotal atelectasis of the right middle lobe. 3. Slight decrease in consolidation in the anterior right upper lobe and sub adjacent right middle lobe. 4. Follow-up of these findings in 6-8 weeks after treatment is recommended to ensure resolution and exclude underlying mass. 5. Increased marked gaseous distention measuring up to 11.5 cm of the colon in the left upper quadrant. It is unclear if this represents redundant sigmoid colon or transverse colon. This may represent colonic pseudo-obstruction as there is contrast mixed with stool in the distal sigmoid colon and rectum. 6. Cortical geographic hypoattenuation within the left kidney and to a lesser extent in the right kidney. This is favored artifactual due to motion and adjacent streak artifact from dense contrast in the colon. Pyelonephritis could appear similarly. Correlate with urinalysis. Electronically Signed   By: Rozell Cornet M.D.   On: 03/04/2024 21:05   DG Chest 2 View Result Date: 03/04/2024 CLINICAL DATA:  Cough and fatigue EXAM: CHEST - 2 VIEW COMPARISON:  February 22, 2024 FINDINGS: No cardiomegaly. Tortuous aorta. Small to moderate right pleural effusion, slightly increased in the interim. Compressive atelectasis in the right lung base. No pneumothorax. Gaseous distension of the colon in the left upper quadrant. IMPRESSION: Slightly increased size of the small to moderate right pleural effusion. Electronically Signed   By: Rance Burrows M.D.   On:  03/04/2024 17:34    Anti-infectives: Anti-infectives (From admission, onward)    Start     Dose/Rate Route Frequency Ordered Stop   03/04/24 2200  levofloxacin  (LEVAQUIN ) IVPB 750 mg  Status:  Discontinued        750 mg 100 mL/hr over 90 Minutes Intravenous Every 24 hours 03/04/24 2149 03/05/24 1031   03/04/24 2130  cefTRIAXone  (ROCEPHIN ) 1 g in sodium chloride  0.9 % 100 mL IVPB        1 g 200 mL/hr over 30 Minutes Intravenous  Once 03/04/24 2124 03/04/24 2349   03/04/24 2130  azithromycin  (ZITHROMAX ) 500 mg in sodium chloride  0.9 % 250 mL IVPB  Status:  Discontinued        500 mg 250 mL/hr over 60 Minutes Intravenous  Once 03/04/24 2124 03/04/24 2153       Assessment/Plan: Impression: Pseudoobstruction of colon.  Most likely secondary to pulmonary status and cerebral palsy. Plan: Will give Dulcolax suppositories today.  No need for surgical intervention at the present time.  LOS: 2 days    Alanda Allegra 03/06/2024

## 2024-03-06 NOTE — Progress Notes (Signed)
 PROGRESS NOTE    Peter Levy  ZOX:[id number] DOB: 11-Nov-1956 DOA: 03/04/2024 PCP: Minus Amel, MD   Brief Narrative:    Peter Levy is a 68 y.o. male with medical history significant of cerebral palsy, recent admission for community-acquired pneumonia. He was treated and discharged to home. He returns due to increasing fatigue, DOE, cough that is not improving despite completion of antibiotics.  He is now noted to have worsening hypoxemia in the setting of possible ongoing pneumonia along with large right pleural effusion.  Plans are for thoracentesis on 4/21.  He is also noted to have colonic pseudoobstruction and had enema on 4/19 with no recorded BM.  Assessment & Plan:   Principal Problem:   Pseudo-obstruction of colon Active Problems:   Cerebral palsy (HCC)   Multifocal pneumonia   Pleural effusion  Assessment and Plan:   Hypoxia with multifocal pneumonia Plan to discontinue IV antibiotics Blood cultures currently pending Procalcitonin is low Plan for thoracentesis on Monday 4/21 CBC in the morning Pleural effusion Thoracentesis needed on 4/21 Cerebral palsy Pseudoobstruction of colon related to cerebral palsy MiraLAX  as needed General Surgery recommended molasses enema/19, no BM recorded   DVT prophylaxis:Lovenox  Code Status: Full Family Communication: Caretaker from group home on phone 4/19 Disposition Plan:  Status is: Inpatient Remains inpatient appropriate because: Need for IV antibiotics.  Consultants:  None  Procedures:  None  Antimicrobials:  Anti-infectives (From admission, onward)    Start     Dose/Rate Route Frequency Ordered Stop   03/04/24 2200  levofloxacin  (LEVAQUIN ) IVPB 750 mg  Status:  Discontinued        750 mg 100 mL/hr over 90 Minutes Intravenous Every 24 hours 03/04/24 2149 03/05/24 1031   03/04/24 2130  cefTRIAXone  (ROCEPHIN ) 1 g in sodium chloride  0.9 % 100 mL IVPB        1 g 200 mL/hr over 30 Minutes Intravenous  Once  03/04/24 2124 03/04/24 2349   03/04/24 2130  azithromycin  (ZITHROMAX ) 500 mg in sodium chloride  0.9 % 250 mL IVPB  Status:  Discontinued        500 mg 250 mL/hr over 60 Minutes Intravenous  Once 03/04/24 2124 03/04/24 2153      Subjective: Patient seen and evaluated today with no new acute complaints or concerns. No acute concerns or events noted overnight.  Objective: Vitals:   03/05/24 1946 03/05/24 2343 03/06/24 0516 03/06/24 0900  BP: 122/82 107/77 112/74 122/70  Pulse: 97 93 92 91  Resp: (!) 22 (!) 24 20 20   Temp: 99.5 F (37.5 C) 98.7 F (37.1 C) 98 F (36.7 C) 98.9 F (37.2 C)  TempSrc: Oral Oral Oral   SpO2: 99% 99% 96% 100%  Weight:      Height:        Intake/Output Summary (Last 24 hours) at 03/06/2024 1005 Last data filed at 03/06/2024 0900 Gross per 24 hour  Intake 1320 ml  Output 150 ml  Net 1170 ml   Filed Weights   03/04/24 1433  Weight: 71.4 kg    Examination:  General exam: Appears calm and comfortable  Respiratory system: Clear to auscultation. Respiratory effort normal. 2L [place] Cardiovascular system: S1 & S2 heard, RRR.  Gastrointestinal system: Abdomen is soft Central nervous system: Alert and awake Extremities: No edema Skin: No significant lesions noted Psychiatry: Flat affect.    Data Reviewed: I have personally reviewed following labs and imaging studies  CBC: Recent Labs  Lab 03/04/24 1453 03/05/24 0517 03/06/24 0432  WBC 6.1 4.5 4.8  NEUTROABS 3.4  --   --   HGB 12.0* 11.1* 11.2*  HCT 37.7* 34.9* 35.8*  MCV 93.5 92.8 94.7  PLT 451* 412* 452*   Basic Metabolic Panel: Recent Labs  Lab 03/04/24 1453 03/05/24 0517 03/06/24 0432  NA 135 140 138  K 4.3 3.6 3.9  CL 103 100 102  CO2 23 25 26   GLUCOSE 101* 102* 114*  BUN 10 8 15   CREATININE 0.91 0.97 0.91  CALCIUM 9.1 9.1 9.0  MG  --   --  2.2   GFR: Estimated Creatinine Clearance: 67.6 mL/min (by C-G formula based on SCr of 0.91 mg/dL). Liver Function Tests: No  results for input(s): "AST", "ALT", "ALKPHOS", "BILITOT", "PROT", "ALBUMIN" in the last 168 hours. No results for input(s): "LIPASE", "AMYLASE" in the last 168 hours. No results for input(s): "AMMONIA" in the last 168 hours. Coagulation Profile: No results for input(s): "INR", "PROTIME" in the last 168 hours. Cardiac Enzymes: No results for input(s): "CKTOTAL", "CKMB", "CKMBINDEX", "TROPONINI" in the last 168 hours. BNP (last 3 results) No results for input(s): "PROBNP" in the last 8760 hours. HbA1C: No results for input(s): "HGBA1C" in the last 72 hours. CBG: Recent Labs  Lab 03/05/24 1407 03/05/24 1635 03/05/24 2345 03/06/24 0600  GLUCAP 146* 151* 135* 110*   Lipid Profile: No results for input(s): "CHOL", "HDL", "LDLCALC", "TRIG", "CHOLHDL", "LDLDIRECT" in the last 72 hours. Thyroid  Function Tests: No results for input(s): "TSH", "T4TOTAL", "FREET4", "T3FREE", "THYROIDAB" in the last 72 hours. Anemia Panel: No results for input(s): "VITAMINB12", "FOLATE", "FERRITIN", "TIBC", "IRON", "RETICCTPCT" in the last 72 hours. Sepsis Labs: Recent Labs  Lab 03/04/24 1605 03/04/24 1743 03/04/24 2253  PROCALCITON  --   --  <0.10  LATICACIDVEN 1.3 1.4  --     Recent Results (from the past 240 hours)  Resp panel by RT-PCR (RSV, Flu A&B, Covid) Anterior Nasal Swab     Status: None   Collection Time: 03/04/24  4:05 PM   Specimen: Anterior Nasal Swab  Result Value Ref Range Status   SARS Coronavirus 2 by RT PCR NEGATIVE NEGATIVE Final    Comment: (NOTE) SARS-CoV-2 target nucleic acids are NOT DETECTED.  The SARS-CoV-2 RNA is generally detectable in upper respiratory specimens during the acute phase of infection. The lowest concentration of SARS-CoV-2 viral copies this assay can detect is 138 copies/mL. A negative result does not preclude SARS-Cov-2 infection and should not be used as the sole basis for treatment or other patient management decisions. A negative result may occur  with  improper specimen collection/handling, submission of specimen other than nasopharyngeal swab, presence of viral mutation(s) within the areas targeted by this assay, and inadequate number of viral copies(<138 copies/mL). A negative result must be combined with clinical observations, patient history, and epidemiological information. The expected result is Negative.  Fact Sheet for Patients:  BloggerCourse.com  Fact Sheet for Healthcare Providers:  SeriousBroker.it  This test is no t yet approved or cleared by the [place]  FDA and  has been authorized for detection and/or diagnosis of SARS-CoV-2 by FDA under an Emergency Use Authorization (EUA). This EUA will remain  in effect (meaning this test can be used) for the duration of the COVID-19 declaration under Section 564(b)(1) of the Act, 21 U.S.C.section 360bbb-3(b)(1), unless the authorization is terminated  or revoked sooner.       Influenza A by PCR NEGATIVE NEGATIVE Final   Influenza B by PCR NEGATIVE NEGATIVE Final  Comment: (NOTE) The Xpert Xpress SARS-CoV-2/FLU/RSV plus assay is intended as an aid in the diagnosis of influenza from Nasopharyngeal swab specimens and should not be used as a sole basis for treatment. Nasal washings and aspirates are unacceptable for Xpert Xpress SARS-CoV-2/FLU/RSV testing.  Fact Sheet for Patients: BloggerCourse.com  Fact Sheet for Healthcare Providers: SeriousBroker.it  This test is not yet approved or cleared by the [place]  FDA and has been authorized for detection and/or diagnosis of SARS-CoV-2 by FDA under an Emergency Use Authorization (EUA). This EUA will remain in effect (meaning this test can be used) for the duration of the COVID-19 declaration under Section 564(b)(1) of the Act, 21 U.S.C. section 360bbb-3(b)(1), unless the authorization is terminated  or revoked.     Resp Syncytial Virus by PCR NEGATIVE NEGATIVE Final    Comment: (NOTE) Fact Sheet for Patients: BloggerCourse.com  Fact Sheet for Healthcare Providers: SeriousBroker.it  This test is not yet approved or cleared by the [place]  FDA and has been authorized for detection and/or diagnosis of SARS-CoV-2 by FDA under an Emergency Use Authorization (EUA). This EUA will remain in effect (meaning this test can be used) for the duration of the COVID-19 declaration under Section 564(b)(1) of the Act, 21 U.S.C. section 360bbb-3(b)(1), unless the authorization is terminated or revoked.  Performed at [facility], [address], [place], [place] [address]   Culture, blood (routine x 2) Call MD if unable to obtain prior to antibiotics being given     Status: None (Preliminary result)   Collection Time: 03/04/24 10:53 PM   Specimen: BLOOD RIGHT HAND  Result Value Ref Range Status   Specimen Description BLOOD RIGHT HAND  Final   Special Requests   Final    BOTTLES DRAWN AEROBIC AND ANAEROBIC Blood Culture adequate volume   Culture   Final    NO GROWTH 2 DAYS Performed at [facility], [address], [place], [place] [address]    Report Status PENDING  Incomplete  Culture, blood (routine x 2) Call MD if unable to obtain prior to antibiotics being given     Status: None (Preliminary result)   Collection Time: 03/04/24 11:00 PM   Specimen: BLOOD RIGHT HAND  Result Value Ref Range Status   Specimen Description BLOOD RIGHT HAND  Final   Special Requests   Final    BOTTLES DRAWN AEROBIC AND ANAEROBIC Blood Culture adequate volume   Culture   Final    NO GROWTH 2 DAYS Performed at [facility], [address], [place], [place] [address]    Report Status PENDING  Incomplete         Radiology Studies: CT Angio Chest PE W and/or Wo Contrast Result Date: 03/04/2024 CLINICAL DATA:  Shortness of breath, recent admission for  pneumonia. Ongoing cough and fatigue. EXAM: CT ANGIOGRAPHY CHEST CT ABDOMEN AND PELVIS WITH CONTRAST TECHNIQUE: Multidetector CT imaging of the chest was performed using the standard protocol during bolus administration of intravenous contrast. Multiplanar CT image reconstructions and MIPs were obtained to evaluate the vascular anatomy. Multidetector CT imaging of the abdomen and pelvis was performed using the standard protocol during bolus administration of intravenous contrast. RADIATION DOSE REDUCTION: This exam was performed according to the departmental dose-optimization program which includes automated exposure control, adjustment of the mA and/or kV according to patient size and/or use of iterative reconstruction technique. CONTRAST:  75mL OMNIPAQUE  IOHEXOL  350 MG/ML SOLN COMPARISON:  Same day chest radiograph; CT abdomen pelvis 10/10/2022; CT chest 02/22/2024 FINDINGS: CTA CHEST  FINDINGS Cardiovascular: Respiratory and cardiac motion limits evaluation. No central pulmonary embolism. The lobar, segmental, and subsegmental pulmonary arteries are obscured by motion. No pericardial effusion. Normal caliber thoracic aorta. Mediastinum/Nodes: Trachea and esophagus are unremarkable. No definite adenopathy. Lungs/Pleura: Large right pleural effusion with near complete atelectasis of the right lower lobe. Subtotal atelectasis of the right middle lobe. Slight decrease in consolidation in the anterior right upper lobe and sub adjacent right middle lobe (series 6/image 69-75). The left lung is clear. No pneumothorax. Musculoskeletal: No acute fracture. Review of the MIP images confirms the above findings. CT ABDOMEN and PELVIS FINDINGS Hepatobiliary: No acute abnormality. Pancreas: Unremarkable. Spleen: Unremarkable. Adrenals/Urinary Tract: Normal adrenal glands. Cortical geographic hypoattenuation within the left kidney and to a lesser extent in the right kidney. This is favored artifactual due to motion and adjacent  streak artifact from dense contrast in the colon. Pyelonephritis could appear similarly. No urinary calculi or hydronephrosis. Unremarkable bladder. Stomach/Bowel: Stomach is within normal limits. Normal caliber small bowel. Dense stool mixed with contrast in the ascending and distal descending/sigmoid colon. Increased marked gaseous distention measuring up to 11.5 cm of the colon in the left upper quadrant it is unclear if this represents redundant sigmoid colon or transverse colon. Vascular/Lymphatic: No significant vascular findings are present. No enlarged abdominal or pelvic lymph nodes. Reproductive: Unremarkable. Other: No free intraperitoneal fluid or air. Left inguinal hernia containing a high riding testicle. Musculoskeletal: No acute fracture. Review of the MIP images confirms the above findings. IMPRESSION: 1. No central pulmonary embolism. The lobar, segmental, and subsegmental pulmonary arteries are obscured by motion. 2. Large right pleural effusion with near complete atelectasis of the right lower lobe and subtotal atelectasis of the right middle lobe. 3. Slight decrease in consolidation in the anterior right upper lobe and sub adjacent right middle lobe. 4. Follow-up of these findings in 6-8 weeks after treatment is recommended to ensure resolution and exclude underlying mass. 5. Increased marked gaseous distention measuring up to 11.5 cm of the colon in the left upper quadrant. It is unclear if this represents redundant sigmoid colon or transverse colon. This may represent colonic pseudo-obstruction as there is contrast mixed with stool in the distal sigmoid colon and rectum. 6. Cortical geographic hypoattenuation within the left kidney and to a lesser extent in the right kidney. This is favored artifactual due to motion and adjacent streak artifact from dense contrast in the colon. Pyelonephritis could appear similarly. Correlate with urinalysis. Electronically Signed   By: Rozell Cornet M.D.    On: 03/04/2024 21:05   CT ABDOMEN PELVIS W CONTRAST Result Date: 03/04/2024 CLINICAL DATA:  Shortness of breath, recent admission for pneumonia. Ongoing cough and fatigue. EXAM: CT ANGIOGRAPHY CHEST CT ABDOMEN AND PELVIS WITH CONTRAST TECHNIQUE: Multidetector CT imaging of the chest was performed using the standard protocol during bolus administration of intravenous contrast. Multiplanar CT image reconstructions and MIPs were obtained to evaluate the vascular anatomy. Multidetector CT imaging of the abdomen and pelvis was performed using the standard protocol during bolus administration of intravenous contrast. RADIATION DOSE REDUCTION: This exam was performed according to the departmental dose-optimization program which includes automated exposure control, adjustment of the mA and/or kV according to patient size and/or use of iterative reconstruction technique. CONTRAST:  75mL OMNIPAQUE  IOHEXOL  350 MG/ML SOLN COMPARISON:  Same day chest radiograph; CT abdomen pelvis 10/10/2022; CT chest 02/22/2024 FINDINGS: CTA CHEST FINDINGS Cardiovascular: Respiratory and cardiac motion limits evaluation. No central pulmonary embolism. The lobar, segmental, and subsegmental pulmonary arteries  are obscured by motion. No pericardial effusion. Normal caliber thoracic aorta. Mediastinum/Nodes: Trachea and esophagus are unremarkable. No definite adenopathy. Lungs/Pleura: Large right pleural effusion with near complete atelectasis of the right lower lobe. Subtotal atelectasis of the right middle lobe. Slight decrease in consolidation in the anterior right upper lobe and sub adjacent right middle lobe (series 6/image 69-75). The left lung is clear. No pneumothorax. Musculoskeletal: No acute fracture. Review of the MIP images confirms the above findings. CT ABDOMEN and PELVIS FINDINGS Hepatobiliary: No acute abnormality. Pancreas: Unremarkable. Spleen: Unremarkable. Adrenals/Urinary Tract: Normal adrenal glands. Cortical geographic  hypoattenuation within the left kidney and to a lesser extent in the right kidney. This is favored artifactual due to motion and adjacent streak artifact from dense contrast in the colon. Pyelonephritis could appear similarly. No urinary calculi or hydronephrosis. Unremarkable bladder. Stomach/Bowel: Stomach is within normal limits. Normal caliber small bowel. Dense stool mixed with contrast in the ascending and distal descending/sigmoid colon. Increased marked gaseous distention measuring up to 11.5 cm of the colon in the left upper quadrant it is unclear if this represents redundant sigmoid colon or transverse colon. Vascular/Lymphatic: No significant vascular findings are present. No enlarged abdominal or pelvic lymph nodes. Reproductive: Unremarkable. Other: No free intraperitoneal fluid or air. Left inguinal hernia containing a high riding testicle. Musculoskeletal: No acute fracture. Review of the MIP images confirms the above findings. IMPRESSION: 1. No central pulmonary embolism. The lobar, segmental, and subsegmental pulmonary arteries are obscured by motion. 2. Large right pleural effusion with near complete atelectasis of the right lower lobe and subtotal atelectasis of the right middle lobe. 3. Slight decrease in consolidation in the anterior right upper lobe and sub adjacent right middle lobe. 4. Follow-up of these findings in 6-8 weeks after treatment is recommended to ensure resolution and exclude underlying mass. 5. Increased marked gaseous distention measuring up to 11.5 cm of the colon in the left upper quadrant. It is unclear if this represents redundant sigmoid colon or transverse colon. This may represent colonic pseudo-obstruction as there is contrast mixed with stool in the distal sigmoid colon and rectum. 6. Cortical geographic hypoattenuation within the left kidney and to a lesser extent in the right kidney. This is favored artifactual due to motion and adjacent streak artifact from dense  contrast in the colon. Pyelonephritis could appear similarly. Correlate with urinalysis. Electronically Signed   By: Rozell Cornet M.D.   On: 03/04/2024 21:05   DG Chest 2 View Result Date: 03/04/2024 CLINICAL DATA:  Cough and fatigue EXAM: CHEST - 2 VIEW COMPARISON:  February 22, 2024 FINDINGS: No cardiomegaly. Tortuous aorta. Small to moderate right pleural effusion, slightly increased in the interim. Compressive atelectasis in the right lung base. No pneumothorax. Gaseous distension of the colon in the left upper quadrant. IMPRESSION: Slightly increased size of the small to moderate right pleural effusion. Electronically Signed   By: Rance Burrows M.D.   On: 03/04/2024 17:34        Scheduled Meds:  enoxaparin  (LOVENOX ) injection  40 mg Subcutaneous Q24H      LOS: 2 days    Time spent: 55 minutes    Peter Beale Loran Rock, DO [facility]  If 7PM-7AM, please contact night-coverage [email or web address] 03/06/2024, 10:05 AM

## 2024-03-06 NOTE — Plan of Care (Signed)
   Problem: Education: Goal: Knowledge of General Education information will improve Description: Including pain rating scale, medication(s)/side effects and non-pharmacologic comfort measures Outcome: Progressing   Problem: Clinical Measurements: Goal: Ability to maintain clinical measurements within normal limits will improve Outcome: Progressing Goal: Diagnostic test results will improve Outcome: Progressing

## 2024-03-06 NOTE — Plan of Care (Signed)

## 2024-03-07 ENCOUNTER — Inpatient Hospital Stay (HOSPITAL_COMMUNITY)

## 2024-03-07 ENCOUNTER — Encounter (HOSPITAL_COMMUNITY): Payer: Self-pay | Admitting: Emergency Medicine

## 2024-03-07 DIAGNOSIS — K5981 Ogilvie syndrome: Secondary | ICD-10-CM | POA: Diagnosis not present

## 2024-03-07 LAB — BODY FLUID CELL COUNT WITH DIFFERENTIAL
Eos, Fluid: 0 %
Lymphs, Fluid: 83 %
Monocyte-Macrophage-Serous Fluid: 9 % — ABNORMAL LOW (ref 50–90)
Neutrophil Count, Fluid: 7 % (ref 0–25)
Total Nucleated Cell Count, Fluid: 1348 uL — ABNORMAL HIGH (ref 0–1000)

## 2024-03-07 LAB — GLUCOSE, CAPILLARY
Glucose-Capillary: 101 mg/dL — ABNORMAL HIGH (ref 70–99)
Glucose-Capillary: 106 mg/dL — ABNORMAL HIGH (ref 70–99)

## 2024-03-07 LAB — PROTEIN, PLEURAL OR PERITONEAL FLUID: Total protein, fluid: 5.8 g/dL

## 2024-03-07 LAB — BASIC METABOLIC PANEL WITH GFR
Anion gap: 8 (ref 5–15)
BUN: 17 mg/dL (ref 8–23)
CO2: 28 mmol/L (ref 22–32)
Calcium: 8.9 mg/dL (ref 8.9–10.3)
Chloride: 102 mmol/L (ref 98–111)
Creatinine, Ser: 0.85 mg/dL (ref 0.61–1.24)
GFR, Estimated: >60 mL/min (ref >60)
Glucose, Bld: 96 mg/dL (ref 70–99)
Potassium: 3.8 mmol/L (ref 3.5–5.1)
Sodium: 138 mmol/L (ref 135–145)

## 2024-03-07 LAB — GLUCOSE, PLEURAL OR PERITONEAL FLUID: Glucose, Fluid: 85 mg/dL

## 2024-03-07 LAB — CBC
HCT: 33.4 % — ABNORMAL LOW (ref 39.0–52.0)
Hemoglobin: 10.5 g/dL — ABNORMAL LOW (ref 13.0–17.0)
MCH: 29.7 pg (ref 26.0–34.0)
MCHC: 31.4 g/dL (ref 30.0–36.0)
MCV: 94.6 fL (ref 80.0–100.0)
Platelets: 406 10*3/uL — ABNORMAL HIGH (ref 150–400)
RBC: 3.53 MIL/uL — ABNORMAL LOW (ref 4.22–5.81)
RDW: 12.2 % (ref 11.5–15.5)
WBC: 5.2 10*3/uL (ref 4.0–10.5)
nRBC: 0 % (ref 0.0–0.2)

## 2024-03-07 LAB — GRAM STAIN: Gram Stain: NONE SEEN

## 2024-03-07 LAB — MAGNESIUM: Magnesium: 2.1 mg/dL (ref 1.7–2.4)

## 2024-03-07 MED ORDER — BUSPIRONE HCL 5 MG PO TABS
5.0000 mg | ORAL_TABLET | Freq: Two times a day (BID) | ORAL | 0 refills | Status: AC
Start: 2024-03-07 — End: ?

## 2024-03-07 MED ORDER — BENZONATATE 200 MG PO CAPS
200.0000 mg | ORAL_CAPSULE | Freq: Three times a day (TID) | ORAL | 0 refills | Status: AC | PRN
Start: 2024-03-07 — End: ?

## 2024-03-07 MED ORDER — POLYETHYLENE GLYCOL 3350 17 G PO PACK
17.0000 g | PACK | Freq: Two times a day (BID) | ORAL | Status: DC
  Administered 2024-03-07: 17 g via ORAL

## 2024-03-07 MED ORDER — GUAIFENESIN-DM 100-10 MG/5ML PO SYRP: 5.0000 mL | ORAL_SOLUTION | ORAL | 0 refills | Status: AC | PRN

## 2024-03-07 MED ORDER — CYCLOBENZAPRINE HCL 10 MG PO TABS: 10.0000 mg | ORAL_TABLET | Freq: Three times a day (TID) | ORAL | 0 refills | Status: AC

## 2024-03-07 MED ORDER — LIDOCAINE HCL (PF) 2 % IJ SOLN
INTRAMUSCULAR | Status: AC
  Filled 2024-03-07: qty 10

## 2024-03-07 NOTE — Progress Notes (Signed)
 Pt completed a Right thoracentesis. 550 cc of red pleural fluid removed. VSS. Pt tolerated well. Pt taken back to room by transporter. Specimens collected by PA and taken to lab by RN

## 2024-03-07 NOTE — NC FL2 (Signed)
 [place]  MEDICAID FL2 LEVEL OF CARE FORM     IDENTIFICATION  Patient Name: Peter Levy Birthdate: December 19, 1955 Sex: male Admission Date (Current Location): 03/04/2024  [facility] and [place] Number:  Peter Levy and Address:  [facility],  [address], [place] [address]      Provider Number: [id number]  Attending Physician Name and Address:  Peter Dill, DO  Relative Name and Phone Number:       Current Level of Care: Hospital Recommended Level of Care: Other (Comment) (group home) Prior Approval Number:    Date Approved/Denied:   PASRR Number:    Discharge Plan: Other (Comment) (group home)    Current Diagnoses: Patient Active Problem List   Diagnosis Date Noted   Pseudo-obstruction of colon 03/04/2024   Pleural effusion 03/04/2024   Normocytic anemia 02/23/2024   Multifocal pneumonia 02/22/2024   Rhabdomyolysis 08/27/2021   Cerebral palsy (HCC) 08/27/2021   Special screening for malignant neoplasms, colon    Failed attempted surgical procedure     Orientation RESPIRATION BLADDER Height & Weight     Self, Time, Situation, Place  Normal Continent Weight: 71.4 kg Height:  5\' 5"  (165.1 cm)  BEHAVIORAL SYMPTOMS/MOOD NEUROLOGICAL BOWEL NUTRITION STATUS      Continent    AMBULATORY STATUS COMMUNICATION OF NEEDS Skin   Limited Assist Verbally Normal                       Personal Care Assistance Level of Assistance  Bathing, Feeding, Dressing Bathing Assistance: Limited assistance Feeding assistance: Limited assistance Dressing Assistance: Limited assistance     Functional Limitations Info  Sight, Hearing, Speech Sight Info: Adequate Hearing Info: Adequate Speech Info: Impaired    SPECIAL CARE FACTORS FREQUENCY  PT (By licensed PT)     PT Frequency: home health              Contractures      Additional Factors Info  Code Status, Allergies, Psychotropic Code Status Info: Full code Allergies Info: No known  allergies Psychotropic Info: Buspar          Current Medications (03/07/2024):   Allergies as of 03/07/2024   No Known Allergies      Medication List     STOP taking these medications    HYDROcodone  bit-homatropine 5-1.5 MG/5ML syrup Commonly known as: HYCODAN       TAKE these medications    albuterol  108 (90 Base) MCG/ACT inhaler Commonly known as: VENTOLIN  HFA Inhale 2 puffs into the lungs every 4 (four) hours as needed for wheezing or shortness of breath.   benzonatate  200 MG capsule Commonly known as: TESSALON  Take 1 capsule (200 mg total) by mouth 3 (three) times daily as needed for cough.   Breztri Aerosphere 160-9-4.8 MCG/ACT Aero inhaler Generic drug: budeson-glycopyrrolate-formoterol  Inhale 2 puffs into the lungs in the morning and at bedtime.   busPIRone  5 MG tablet Commonly known as: BUSPAR  Take 1 tablet (5 mg total) by mouth 2 (two) times daily.   cyclobenzaprine  10 MG tablet Commonly known as: FLEXERIL  Take 1 tablet (10 mg total) by mouth 3 (three) times daily.   GAS RELIEF 125 MG Caps Generic drug: Simethicone Take 1 capsule by mouth daily as needed (gas).   GoodSense ClearLax 17 GM/SCOOP powder Generic drug: polyethylene glycol powder Take 17 g by mouth daily.   polyethylene glycol 17 g packet Commonly known as: MIRALAX  / GLYCOLAX  Take 1 packet by mouth daily.  guaiFENesin -dextromethorphan  100-10 MG/5ML syrup Commonly known as: ROBITUSSIN DM Take 5 mLs by mouth every 4 (four) hours as needed for cough.   Vitamin D3 50 MCG (2000 UT) capsule Take 2,000 Units by mouth daily.       This is the current hospital active medication list Current Facility-Administered Medications  Medication Dose Route Frequency Provider Last Rate Last Admin   acetaminophen  (TYLENOL ) tablet 650 mg  650 mg Oral Q6H PRN Shah, Peter D, DO   650 mg at 03/05/24 1402   bisacodyl  (DULCOLAX) suppository 10 mg  10 mg Rectal BID Alanda Allegra, MD   10 mg at 03/07/24  1610   enoxaparin  (LOVENOX ) injection 40 mg  40 mg Subcutaneous Q24H Stinson, Peter J, DO   40 mg at 03/06/24 2129   guaiFENesin -dextromethorphan  (ROBITUSSIN DM) 100-10 MG/5ML syrup 5 mL  5 mL Oral Q4H PRN Shah, Peter D, DO   5 mL at 03/07/24 9604   ondansetron  (ZOFRAN ) tablet 4 mg  4 mg Oral Q6H PRN Stinson, Peter J, DO       Or   ondansetron  (ZOFRAN ) injection 4 mg  4 mg Intravenous Q6H PRN Stinson, Peter J, DO       polyethylene glycol (MIRALAX  / GLYCOLAX ) packet 17 g  17 g Oral BID Alanda Allegra, MD   17 g at 03/07/24 1000     Discharge Medications: Please see discharge summary for a list of discharge medications.  Relevant Imaging Results:  Relevant Lab Results:   Additional Information [facility]  Peter Klinefelter, RN

## 2024-03-07 NOTE — Procedures (Signed)
 PROCEDURE SUMMARY:  Successful image-guided right thoracentesis. Yielded 550 milliliters of bloody fluid. US  exam shows partially loculated pleural effusion, the largest pocket was selected for procedure however residual fluid remains on post procedure US . It is unlikely that repeat thoracentesis would be able to remove any further significant fluid. Patient tolerated procedure well. EBL < 1 mL No immediate complications. Patient with persistent cough during and after procedure, denies chest pain or subjective dyspnea. VSS throughout procedure.  Specimen was sent for labs. Post procedure CXR obtained and pending final read.  Please see imaging section of Epic for full dictation.  Marilu Shown PA-C 03/07/2024 2:45 PM

## 2024-03-07 NOTE — Progress Notes (Signed)
  Subjective: Patient resting comfortably.  No abdominal pain noted.  Objective: Vital signs in last 24 hours: Temp:  [97.9 F (36.6 C)-99 F (37.2 C)] 99 F (37.2 C) (04/21 0548) Pulse Rate:  [79-99] 79 (04/21 0548) Resp:  [20] 20 (04/21 0548) BP: (110-133)/(65-90) 133/77 (04/21 0548) SpO2:  [94 %-100 %] 97 % (04/21 0548) Last BM Date : 03/05/24  Intake/Output from previous day: 04/20 0701 - 04/21 0700 In: 480 [P.O.:480] Out: 700 [Urine:700] Intake/Output this shift: No intake/output data recorded.  General appearance: alert, cooperative, and no distress GI: Soft, much less distended.  Bowel sounds are present.  Lab Results:  Recent Labs    03/06/24 0432 03/07/24 0456  WBC 4.8 5.2  HGB 11.2* 10.5*  HCT 35.8* 33.4*  PLT 452* 406*   BMET Recent Labs    03/06/24 0432 03/07/24 0456  NA 138 138  K 3.9 3.8  CL 102 102  CO2 26 28  GLUCOSE 114* 96  BUN 15 17  CREATININE 0.91 0.85  CALCIUM 9.0 8.9   PT/INR No results for input(s): "LABPROT", "INR" in the last 72 hours.  Studies/Results: No results found.  Anti-infectives: Anti-infectives (From admission, onward)    Start     Dose/Rate Route Frequency Ordered Stop   03/04/24 2200  levofloxacin  (LEVAQUIN ) IVPB 750 mg  Status:  Discontinued        750 mg 100 mL/hr over 90 Minutes Intravenous Every 24 hours 03/04/24 2149 03/05/24 1031   03/04/24 2130  cefTRIAXone  (ROCEPHIN ) 1 g in sodium chloride  0.9 % 100 mL IVPB        1 g 200 mL/hr over 30 Minutes Intravenous  Once 03/04/24 2124 03/04/24 2349   03/04/24 2130  azithromycin  (ZITHROMAX ) 500 mg in sodium chloride  0.9 % 250 mL IVPB  Status:  Discontinued        500 mg 250 mL/hr over 60 Minutes Intravenous  Once 03/04/24 2124 03/04/24 2153       Assessment/Plan: Impression: Pseudoobstruction of colon.  Patient continues to have bowel function.  Much less distention today.  Would continue bowel regimen.  For thoracentesis today.  LOS: 3 days    Alanda Allegra 03/07/2024

## 2024-03-07 NOTE — TOC Initial Note (Addendum)
 Transition of Care [facility]) - Initial/Assessment Note    Patient Details  Name: Peter Levy MRN: [id number] Date of Birth: 06-27-1956  [facility] CM/SW Contact:    Ander Katos, LCSW Phone Number: 03/07/2024, 9:01 AM  Clinical Narrative: Pt admitted due to pseudo-obstruction of colon. Pt has been a resident at [facility] group home for the past 5 years. LCSW spoke with Destiny at facility who reports they assist with bathing, ambulating, and eating. Pt has a walker and shower chair at facility. He is active with [facility]. Cory with [facility] aware of admission. Pt sleeping at time of assessment. Per Destiny, okay for return. TOC will continue to follow.                  Expected Discharge Plan: Group Home Barriers to Discharge: Continued Medical Work up   Patient Goals and CMS Choice [name]   Choice offered to / list presented to : Patient [place] ownership interest in [facility].provided to::  (n/a)    Expected Discharge Plan and Services In-house Referral: Clinical Social Work     Living arrangements for the past 2 months: Group Home                           HH Arranged: PT HH Agency: [facility] Date [facility] Agency Contacted: 03/07/24 Time HH Agency Contacted: [phone] Representative spoke with at [facility] Agency: Randel Buss  Prior Living Arrangements/Services Living arrangements for the past 2 months: Group Home Lives with:: Facility Resident Patient language and need for interpreter reviewed:: Yes Do you feel safe going back to the place where you live?: Yes      Need for Family Participation in Patient Care: No (Comment) Care giver support system in place?: Yes (comment)   Criminal Activity/Legal Involvement Pertinent to Current Situation/Hospitalization: No - Comment as needed  Activities of Daily Living   ADL Screening  (condition at time of admission) Independently performs ADLs?: No Does the patient have a NEW difficulty with bathing/dressing/toileting/self-feeding that is expected to last >3 days?: No Does the patient have a NEW difficulty with getting in/out of bed, walking, or climbing stairs that is expected to last >3 days?: No Does the patient have a NEW difficulty with communication that is expected to last >3 days?: No Is the patient deaf or have difficulty hearing?: No Does the patient have difficulty seeing, even when wearing glasses/contacts?: No Does the patient have difficulty concentrating, remembering, or making decisions?: Yes  Permission Sought/Granted                  Emotional Assessment     Affect (typically observed): Appropriate Orientation: : Oriented to Self, Oriented to Place, Oriented to  Time, Oriented to Situation Alcohol / Substance Use: Not Applicable Psych Involvement: No (comment)  Admission diagnosis:  Pleural effusion [J90] Hypoxia [R09.02] Pseudo-obstruction of colon [K59.81] Dyspnea, unspecified type [R06.00] Patient Active Problem List   Diagnosis Date Noted   Pseudo-obstruction of colon 03/04/2024   Pleural effusion 03/04/2024   Normocytic anemia 02/23/2024   Multifocal pneumonia 02/22/2024   Rhabdomyolysis 08/27/2021   Cerebral palsy (HCC) 08/27/2021   Special screening for malignant neoplasms, colon    Failed attempted surgical procedure    PCP:  Minus Amel, MD Pharmacy:   [facility] [place], [place] - [address]  [address] [place] [address] Phone: [phone] Fax: [phone]     Social Drivers of Health (SDOH) Social History: SDOH Screenings   Food Insecurity: No Food Insecurity (03/04/2024)  Housing: Low Risk  (03/04/2024)  Transportation Needs: No Transportation Needs (03/04/2024)  Utilities: Not At Risk (03/04/2024)  Social Connections: Unknown (03/04/2024)  Recent Concern: Social Connections - Socially Isolated  (02/22/2024)  Tobacco Use: Low Risk  (03/04/2024)   SDOH Interventions:     Readmission Risk Interventions     No data to display

## 2024-03-07 NOTE — Discharge Summary (Addendum)
 Physician Discharge Summary  Peter Levy ZOX:[id number] DOB: Jun 30, 1956 DOA: 03/04/2024  PCP: Minus Amel, MD  Admit date: 03/04/2024  Discharge date: 03/07/2024  Admitted From:Group home  Disposition:  Group home  Recommendations for Outpatient Follow-up:  Follow up with PCP in 1-2 weeks Continue on MiraLAX  as prior daily as prescribed Continue antitussives as needed for cough with no further need for antibiotics Continue other home medications as prescribed  Home Health: Yes with PT  Equipment/Devices: None  Discharge Condition:Stable  CODE STATUS: Full  Diet recommendation: Heart Healthy  Brief/Interim Summary: Peter Levy is a 68 y.o. male with medical history significant of cerebral palsy, recent admission for community-acquired pneumonia. He was treated and discharged to home. He returns due to increasing fatigue, DOE, cough that is not improving despite completion of antibiotics.  He is now noted to have worsening hypoxemia in the setting of possible ongoing pneumonia along with large right pleural effusion.  Plans are for thoracentesis on 4/21.  He is also noted to have colonic pseudoobstruction and had enema on 4/19 with no recorded BM.  He has had several bowel movements now recorded and has undergone thoracentesis on 4/21 with no immediate complications.  There was a 550 mL of bloody fluid noted that was partially loculated.  Chest x-ray shows improvement and no signs of pneumothorax.  Discharge Diagnoses:  Principal Problem:   Pseudo-obstruction of colon Active Problems:   Cerebral palsy (HCC)   Multifocal pneumonia   Pleural effusion  Principal discharge diagnosis: Acute hypoxemic respiratory failure secondary to developing pleural effusion in the setting of recent multifocal pneumonia.  Discharge Instructions  Discharge Instructions     Diet - low sodium heart healthy   Complete by: As directed    Increase activity slowly   Complete by: As directed        Allergies as of 03/07/2024   No Known Allergies      Medication List     STOP taking these medications    HYDROcodone  bit-homatropine 5-1.5 MG/5ML syrup Commonly known as: HYCODAN       TAKE these medications    albuterol  108 (90 Base) MCG/ACT inhaler Commonly known as: VENTOLIN  HFA Inhale 2 puffs into the lungs every 4 (four) hours as needed for wheezing or shortness of breath.   benzonatate  200 MG capsule Commonly known as: TESSALON  Take 1 capsule (200 mg total) by mouth 3 (three) times daily as needed for cough.   Breztri Aerosphere 160-9-4.8 MCG/ACT Aero inhaler Generic drug: budeson-glycopyrrolate-formoterol  Inhale 2 puffs into the lungs in the morning and at bedtime.   busPIRone  5 MG tablet Commonly known as: BUSPAR  Take 1 tablet (5 mg total) by mouth 2 (two) times daily.   cyclobenzaprine  10 MG tablet Commonly known as: FLEXERIL  Take 1 tablet (10 mg total) by mouth 3 (three) times daily.   GAS RELIEF 125 MG Caps Generic drug: Simethicone Take 1 capsule by mouth daily as needed (gas).   GoodSense ClearLax 17 GM/SCOOP powder Generic drug: polyethylene glycol powder Take 17 g by mouth daily.   polyethylene glycol 17 g packet Commonly known as: MIRALAX  / GLYCOLAX  Take 1 packet by mouth daily.   guaiFENesin -dextromethorphan  100-10 MG/5ML syrup Commonly known as: ROBITUSSIN DM Take 5 mLs by mouth every 4 (four) hours as needed for cough.   Vitamin D3 50 MCG (2000 UT) capsule Take 2,000 Units by mouth daily.        Follow-up Information     Minus Amel, MD. Schedule an  appointment as soon as possible for a visit in 1 week(s).   Specialty: Family Medicine Contact information: [address] [place] [address] [phone]                No Known Allergies  Consultations: General surgery   Procedures/Studies: CT Angio Chest PE W and/or Wo Contrast Result Date: 03/04/2024 CLINICAL DATA:  Shortness of breath, recent  admission for pneumonia. Ongoing cough and fatigue. EXAM: CT ANGIOGRAPHY CHEST CT ABDOMEN AND PELVIS WITH CONTRAST TECHNIQUE: Multidetector CT imaging of the chest was performed using the standard protocol during bolus administration of intravenous contrast. Multiplanar CT image reconstructions and MIPs were obtained to evaluate the vascular anatomy. Multidetector CT imaging of the abdomen and pelvis was performed using the standard protocol during bolus administration of intravenous contrast. RADIATION DOSE REDUCTION: This exam was performed according to the departmental dose-optimization program which includes automated exposure control, adjustment of the mA and/or kV according to patient size and/or use of iterative reconstruction technique. CONTRAST:  75mL OMNIPAQUE  IOHEXOL  350 MG/ML SOLN COMPARISON:  Same day chest radiograph; CT abdomen pelvis 10/10/2022; CT chest 02/22/2024 FINDINGS: CTA CHEST FINDINGS Cardiovascular: Respiratory and cardiac motion limits evaluation. No central pulmonary embolism. The lobar, segmental, and subsegmental pulmonary arteries are obscured by motion. No pericardial effusion. Normal caliber thoracic aorta. Mediastinum/Nodes: Trachea and esophagus are unremarkable. No definite adenopathy. Lungs/Pleura: Large right pleural effusion with near complete atelectasis of the right lower lobe. Subtotal atelectasis of the right middle lobe. Slight decrease in consolidation in the anterior right upper lobe and sub adjacent right middle lobe (series 6/image 69-75). The left lung is clear. No pneumothorax. Musculoskeletal: No acute fracture. Review of the MIP images confirms the above findings. CT ABDOMEN and PELVIS FINDINGS Hepatobiliary: No acute abnormality. Pancreas: Unremarkable. Spleen: Unremarkable. Adrenals/Urinary Tract: Normal adrenal glands. Cortical geographic hypoattenuation within the left kidney and to a lesser extent in the right kidney. This is favored artifactual due to motion  and adjacent streak artifact from dense contrast in the colon. Pyelonephritis could appear similarly. No urinary calculi or hydronephrosis. Unremarkable bladder. Stomach/Bowel: Stomach is within normal limits. Normal caliber small bowel. Dense stool mixed with contrast in the ascending and distal descending/sigmoid colon. Increased marked gaseous distention measuring up to 11.5 cm of the colon in the left upper quadrant it is unclear if this represents redundant sigmoid colon or transverse colon. Vascular/Lymphatic: No significant vascular findings are present. No enlarged abdominal or pelvic lymph nodes. Reproductive: Unremarkable. Other: No free intraperitoneal fluid or air. Left inguinal hernia containing a high riding testicle. Musculoskeletal: No acute fracture. Review of the MIP images confirms the above findings. IMPRESSION: 1. No central pulmonary embolism. The lobar, segmental, and subsegmental pulmonary arteries are obscured by motion. 2. Large right pleural effusion with near complete atelectasis of the right lower lobe and subtotal atelectasis of the right middle lobe. 3. Slight decrease in consolidation in the anterior right upper lobe and sub adjacent right middle lobe. 4. Follow-up of these findings in 6-8 weeks after treatment is recommended to ensure resolution and exclude underlying mass. 5. Increased marked gaseous distention measuring up to 11.5 cm of the colon in the left upper quadrant. It is unclear if this represents redundant sigmoid colon or transverse colon. This may represent colonic pseudo-obstruction as there is contrast mixed with stool in the distal sigmoid colon and rectum. 6. Cortical geographic hypoattenuation within the left kidney and to a lesser extent in the right kidney. This is favored artifactual due to  motion and adjacent streak artifact from dense contrast in the colon. Pyelonephritis could appear similarly. Correlate with urinalysis. Electronically Signed   By: Rozell Cornet M.D.   On: 03/04/2024 21:05   CT ABDOMEN PELVIS W CONTRAST Result Date: 03/04/2024 CLINICAL DATA:  Shortness of breath, recent admission for pneumonia. Ongoing cough and fatigue. EXAM: CT ANGIOGRAPHY CHEST CT ABDOMEN AND PELVIS WITH CONTRAST TECHNIQUE: Multidetector CT imaging of the chest was performed using the standard protocol during bolus administration of intravenous contrast. Multiplanar CT image reconstructions and MIPs were obtained to evaluate the vascular anatomy. Multidetector CT imaging of the abdomen and pelvis was performed using the standard protocol during bolus administration of intravenous contrast. RADIATION DOSE REDUCTION: This exam was performed according to the departmental dose-optimization program which includes automated exposure control, adjustment of the mA and/or kV according to patient size and/or use of iterative reconstruction technique. CONTRAST:  75mL OMNIPAQUE  IOHEXOL  350 MG/ML SOLN COMPARISON:  Same day chest radiograph; CT abdomen pelvis 10/10/2022; CT chest 02/22/2024 FINDINGS: CTA CHEST FINDINGS Cardiovascular: Respiratory and cardiac motion limits evaluation. No central pulmonary embolism. The lobar, segmental, and subsegmental pulmonary arteries are obscured by motion. No pericardial effusion. Normal caliber thoracic aorta. Mediastinum/Nodes: Trachea and esophagus are unremarkable. No definite adenopathy. Lungs/Pleura: Large right pleural effusion with near complete atelectasis of the right lower lobe. Subtotal atelectasis of the right middle lobe. Slight decrease in consolidation in the anterior right upper lobe and sub adjacent right middle lobe (series 6/image 69-75). The left lung is clear. No pneumothorax. Musculoskeletal: No acute fracture. Review of the MIP images confirms the above findings. CT ABDOMEN and PELVIS FINDINGS Hepatobiliary: No acute abnormality. Pancreas: Unremarkable. Spleen: Unremarkable. Adrenals/Urinary Tract: Normal adrenal glands.  Cortical geographic hypoattenuation within the left kidney and to a lesser extent in the right kidney. This is favored artifactual due to motion and adjacent streak artifact from dense contrast in the colon. Pyelonephritis could appear similarly. No urinary calculi or hydronephrosis. Unremarkable bladder. Stomach/Bowel: Stomach is within normal limits. Normal caliber small bowel. Dense stool mixed with contrast in the ascending and distal descending/sigmoid colon. Increased marked gaseous distention measuring up to 11.5 cm of the colon in the left upper quadrant it is unclear if this represents redundant sigmoid colon or transverse colon. Vascular/Lymphatic: No significant vascular findings are present. No enlarged abdominal or pelvic lymph nodes. Reproductive: Unremarkable. Other: No free intraperitoneal fluid or air. Left inguinal hernia containing a high riding testicle. Musculoskeletal: No acute fracture. Review of the MIP images confirms the above findings. IMPRESSION: 1. No central pulmonary embolism. The lobar, segmental, and subsegmental pulmonary arteries are obscured by motion. 2. Large right pleural effusion with near complete atelectasis of the right lower lobe and subtotal atelectasis of the right middle lobe. 3. Slight decrease in consolidation in the anterior right upper lobe and sub adjacent right middle lobe. 4. Follow-up of these findings in 6-8 weeks after treatment is recommended to ensure resolution and exclude underlying mass. 5. Increased marked gaseous distention measuring up to 11.5 cm of the colon in the left upper quadrant. It is unclear if this represents redundant sigmoid colon or transverse colon. This may represent colonic pseudo-obstruction as there is contrast mixed with stool in the distal sigmoid colon and rectum. 6. Cortical geographic hypoattenuation within the left kidney and to a lesser extent in the right kidney. This is favored artifactual due to motion and adjacent streak  artifact from dense contrast in the colon. Pyelonephritis could appear similarly. Correlate with urinalysis.  Electronically Signed   By: Rozell Cornet M.D.   On: 03/04/2024 21:05   DG Chest 2 View Result Date: 03/04/2024 CLINICAL DATA:  Cough and fatigue EXAM: CHEST - 2 VIEW COMPARISON:  February 22, 2024 FINDINGS: No cardiomegaly. Tortuous aorta. Small to moderate right pleural effusion, slightly increased in the interim. Compressive atelectasis in the right lung base. No pneumothorax. Gaseous distension of the colon in the left upper quadrant. IMPRESSION: Slightly increased size of the small to moderate right pleural effusion. Electronically Signed   By: Rance Burrows M.D.   On: 03/04/2024 17:34   DG Swallowing Func-Speech Pathology Result Date: 02/26/2024 CLINICAL DATA:  Dysphagia. Cough/GE reflux disease/other secondary diagnosis EXAM: MODIFIED BARIUM SWALLOW TECHNIQUE: Different consistencies of barium were administered orally to the patient by the Speech Pathologist. Imaging of the pharynx was performed in the lateral projection. Radiologist, not in attendance for the exam. FLUOROSCOPY TIME:  Radiation Exposure Index (as provided by the fluoroscopic device): Dose area product 577.86 uGy*m2 COMPARISON:  None Available. FINDINGS: Modified barium swallow was performed by the speech pathologist. Radiologist was not involved with this exam. Please refer to the Speech Pathology report for results and recommendations. IMPRESSION: Please refer to the Speech Pathologists report for complete details and recommendations. Electronically Signed   By: Audree Leas M.D.   On: 02/26/2024 10:38   CT Chest W Contrast Result Date: 02/22/2024 CLINICAL DATA:  Chronic dyspnea. EXAM: CT CHEST WITH CONTRAST TECHNIQUE: Multidetector CT imaging of the chest was performed during intravenous contrast administration. RADIATION DOSE REDUCTION: This exam was performed according to the departmental dose-optimization program  which includes automated exposure control, adjustment of the mA and/or kV according to patient size and/or use of iterative reconstruction technique. CONTRAST:  75mL OMNIPAQUE  IOHEXOL  300 MG/ML  SOLN COMPARISON:  Chest radiograph of earlier today. FINDINGS: Cardiovascular: Moderate motion degradation throughout. Normal aortic caliber. Normal heart size, without pericardial effusion. Mediastinum/Nodes: No mediastinal or hilar adenopathy. Lungs/Pleura: Small right pleural effusion. Anterior inferior right upper lobe and subjacent right middle lobe probable consolidation. The anterior inferior component is somewhat more masslike, without traversing air bronchograms on 78/2. Superior anterior right middle lobe 4 mm pulmonary nodule on 84/3. Dependent right lower lobe airspace disease. Upper Abdomen: Motion degradation continuing into the upper abdomen. No gross acute abnormality identified. Mildly prominent, gas-filled transverse colon. Musculoskeletal: No acute osseous abnormality. IMPRESSION: 1. Moderate motion degradation throughout. 2. Small right pleural effusion with right lower lobe airspace disease, favored to represent pneumonia. A component of this could represent compressive atelectasis. 3. Anterior inferior right upper and superior right middle lobe areas of consolidation, also likely pneumonia. The anterior inferior right upper lobe component is somewhat more nodular and neoplasm is felt less likely. Consider appropriate antibiotic therapy and chest CT follow-up at 4-6 weeks. 4. 4 mm right middle lobe pulmonary nodule can be presumed benign and do/does not warrant imaging follow-up per Fleischner criteria. Electronically Signed   By: Lore Rode M.D.   On: 02/22/2024 16:54   DG Chest 2 View Result Date: 02/22/2024 CLINICAL DATA:  Cough and shortness of breath. EXAM: CHEST - 2 VIEW COMPARISON:  Chest radiograph dated 12/30/2023. FINDINGS: The heart size and mediastinal contours are within normal limits.  New small right pleural effusion with suspected loculated component along the lateral chest wall. There is associated right basilar atelectasis. The left lung appears clear. No definite pneumothorax. Dilated air-filled loops of bowel are again noted beneath the left hemidiaphragm. IMPRESSION: 1. New small right pleural  effusion with suspected loculation laterally and associated basilar atelectasis. 2. Dilated air-filled loops of bowel are again noted beneath the left hemidiaphragm. Clinical correlation is recommended. Electronically Signed   By: Mannie Seek M.D.   On: 02/22/2024 15:10     Discharge Exam: Vitals:   03/07/24 1437 03/07/24 1440  BP:    Pulse:  90  Resp:  18  Temp:    SpO2: 99% 99%   Vitals:   03/07/24 1414 03/07/24 1430 03/07/24 1437 03/07/24 1440  BP: 127/76 97/65    Pulse: 80 91  90  Resp: 17 17  18   Temp: 98.6 F (37 C)     TempSrc: Oral     SpO2: 98% 99% 99% 99%  Weight:      Height:        General: Pt is alert, awake, not in acute distress Cardiovascular: RRR, S1/S2 +, no rubs, no gallops Respiratory: CTA bilaterally, no wheezing, no rhonchi Abdominal: Soft, NT, ND, bowel sounds + Extremities: no edema, no cyanosis    The results of significant diagnostics from this hospitalization (including imaging, microbiology, ancillary and laboratory) are listed below for reference.     Microbiology: Recent Results (from the past 240 hours)  Resp panel by RT-PCR (RSV, Flu A&B, Covid) Anterior Nasal Swab     Status: None   Collection Time: 03/04/24  4:05 PM   Specimen: Anterior Nasal Swab  Result Value Ref Range Status   SARS Coronavirus 2 by RT PCR NEGATIVE NEGATIVE Final    Comment: (NOTE) SARS-CoV-2 target nucleic acids are NOT DETECTED.  The SARS-CoV-2 RNA is generally detectable in upper respiratory specimens during the acute phase of infection. The lowest concentration of SARS-CoV-2 viral copies this assay can detect is 138 copies/mL. A negative  result does not preclude SARS-Cov-2 infection and should not be used as the sole basis for treatment or other patient management decisions. A negative result may occur with  improper specimen collection/handling, submission of specimen other than nasopharyngeal swab, presence of viral mutation(s) within the areas targeted by this assay, and inadequate number of viral copies(<138 copies/mL). A negative result must be combined with clinical observations, patient history, and epidemiological information. The expected result is Negative.  Fact Sheet for Patients:  BloggerCourse.com  Fact Sheet for Healthcare Providers:  SeriousBroker.it  This test is no t yet approved or cleared by the [place]  FDA and  has been authorized for detection and/or diagnosis of SARS-CoV-2 by FDA under an Emergency Use Authorization (EUA). This EUA will remain  in effect (meaning this test can be used) for the duration of the COVID-19 declaration under Section 564(b)(1) of the Act, 21 U.S.C.section 360bbb-3(b)(1), unless the authorization is terminated  or revoked sooner.       Influenza A by PCR NEGATIVE NEGATIVE Final   Influenza B by PCR NEGATIVE NEGATIVE Final    Comment: (NOTE) The Xpert Xpress SARS-CoV-2/FLU/RSV plus assay is intended as an aid in the diagnosis of influenza from Nasopharyngeal swab specimens and should not be used as a sole basis for treatment. Nasal washings and aspirates are unacceptable for Xpert Xpress SARS-CoV-2/FLU/RSV testing.  Fact Sheet for Patients: BloggerCourse.com  Fact Sheet for Healthcare Providers: SeriousBroker.it  This test is not yet approved or cleared by the [place]  FDA and has been authorized for detection and/or diagnosis of SARS-CoV-2 by FDA under an Emergency Use Authorization (EUA). This EUA will remain in effect (meaning this test can be used)  for the duration of the  COVID-19 declaration under Section 564(b)(1) of the Act, 21 U.S.C. section 360bbb-3(b)(1), unless the authorization is terminated or revoked.     Resp Syncytial Virus by PCR NEGATIVE NEGATIVE Final    Comment: (NOTE) Fact Sheet for Patients: BloggerCourse.com  Fact Sheet for Healthcare Providers: SeriousBroker.it  This test is not yet approved or cleared by the [place]  FDA and has been authorized for detection and/or diagnosis of SARS-CoV-2 by FDA under an Emergency Use Authorization (EUA). This EUA will remain in effect (meaning this test can be used) for the duration of the COVID-19 declaration under Section 564(b)(1) of the Act, 21 U.S.C. section 360bbb-3(b)(1), unless the authorization is terminated or revoked.  Performed at [facility], [address], [place], [place] [address]   Culture, blood (routine x 2) Call MD if unable to obtain prior to antibiotics being given     Status: None (Preliminary result)   Collection Time: 03/04/24 10:53 PM   Specimen: BLOOD RIGHT HAND  Result Value Ref Range Status   Specimen Description BLOOD RIGHT HAND  Final   Special Requests   Final    BOTTLES DRAWN AEROBIC AND ANAEROBIC Blood Culture adequate volume   Culture   Final    NO GROWTH 3 DAYS Performed at [facility], [address], [place], [place] [address]    Report Status PENDING  Incomplete  Culture, blood (routine x 2) Call MD if unable to obtain prior to antibiotics being given     Status: None (Preliminary result)   Collection Time: 03/04/24 11:00 PM   Specimen: BLOOD RIGHT HAND  Result Value Ref Range Status   Specimen Description BLOOD RIGHT HAND  Final   Special Requests   Final    BOTTLES DRAWN AEROBIC AND ANAEROBIC Blood Culture adequate volume   Culture   Final    NO GROWTH 3 DAYS Performed at [facility], [address], [place], [place] [address]    Report Status PENDING  Incomplete      Labs: BNP (last 3 results) Recent Labs    02/22/24 1345 03/04/24 1604  BNP 31.0 54.0   Basic Metabolic Panel: Recent Labs  Lab 03/04/24 1453 03/05/24 0517 03/06/24 0432 03/07/24 0456  NA 135 140 138 138  K 4.3 3.6 3.9 3.8  CL 103 100 102 102  CO2 23 25 26 28   GLUCOSE 101* 102* 114* 96  BUN 10 8 15 17   CREATININE 0.91 0.97 0.91 0.85  CALCIUM 9.1 9.1 9.0 8.9  MG  --   --  2.2 2.1   Liver Function Tests: No results for input(s): "AST", "ALT", "ALKPHOS", "BILITOT", "PROT", "ALBUMIN" in the last 168 hours. No results for input(s): "LIPASE", "AMYLASE" in the last 168 hours. No results for input(s): "AMMONIA" in the last 168 hours. CBC: Recent Labs  Lab 03/04/24 1453 03/05/24 0517 03/06/24 0432 03/07/24 0456  WBC 6.1 4.5 4.8 5.2  NEUTROABS 3.4  --   --   --   HGB 12.0* 11.1* 11.2* 10.5*  HCT 37.7* 34.9* 35.8* 33.4*  MCV 93.5 92.8 94.7 94.6  PLT 451* 412* 452* 406*   Cardiac Enzymes: No results for input(s): "CKTOTAL", "CKMB", "CKMBINDEX", "TROPONINI" in the last 168 hours. BNP: Invalid input(s): "POCBNP" CBG: Recent Labs  Lab 03/06/24 0600 03/06/24 1109 03/06/24 1738 03/07/24 0549 03/07/24 1117  GLUCAP 110* 118* 97 101* 106*   D-Dimer No results for input(s): "DDIMER" in the last 72 hours. Hgb A1c No results for input(s): "HGBA1C" in the last 72 hours. Lipid  Profile No results for input(s): "CHOL", "HDL", "LDLCALC", "TRIG", "CHOLHDL", "LDLDIRECT" in the last 72 hours. Thyroid  function studies No results for input(s): "TSH", "T4TOTAL", "T3FREE", "THYROIDAB" in the last 72 hours.  Invalid input(s): "FREET3" Anemia work up No results for input(s): "VITAMINB12", "FOLATE", "FERRITIN", "TIBC", "IRON", "RETICCTPCT" in the last 72 hours. Urinalysis    Component Value Date/Time   COLORURINE YELLOW 03/04/2024 2109   APPEARANCEUR CLEAR 03/04/2024 2109   LABSPEC >1.046 (H) 03/04/2024 2109   PHURINE 5.0 03/04/2024 2109   GLUCOSEU NEGATIVE 03/04/2024 2109    HGBUR NEGATIVE 03/04/2024 2109   BILIRUBINUR NEGATIVE 03/04/2024 2109   KETONESUR 20 (A) 03/04/2024 2109   PROTEINUR NEGATIVE 03/04/2024 2109   UROBILINOGEN 0.2 11/04/2011 1724   NITRITE NEGATIVE 03/04/2024 2109   LEUKOCYTESUR NEGATIVE 03/04/2024 2109   Sepsis Labs Recent Labs  Lab 03/04/24 1453 03/05/24 0517 03/06/24 0432 03/07/24 0456  WBC 6.1 4.5 4.8 5.2   Microbiology Recent Results (from the past 240 hours)  Resp panel by RT-PCR (RSV, Flu A&B, Covid) Anterior Nasal Swab     Status: None   Collection Time: 03/04/24  4:05 PM   Specimen: Anterior Nasal Swab  Result Value Ref Range Status   SARS Coronavirus 2 by RT PCR NEGATIVE NEGATIVE Final    Comment: (NOTE) SARS-CoV-2 target nucleic acids are NOT DETECTED.  The SARS-CoV-2 RNA is generally detectable in upper respiratory specimens during the acute phase of infection. The lowest concentration of SARS-CoV-2 viral copies this assay can detect is 138 copies/mL. A negative result does not preclude SARS-Cov-2 infection and should not be used as the sole basis for treatment or other patient management decisions. A negative result may occur with  improper specimen collection/handling, submission of specimen other than nasopharyngeal swab, presence of viral mutation(s) within the areas targeted by this assay, and inadequate number of viral copies(<138 copies/mL). A negative result must be combined with clinical observations, patient history, and epidemiological information. The expected result is Negative.  Fact Sheet for Patients:  BloggerCourse.com  Fact Sheet for Healthcare Providers:  SeriousBroker.it  This test is no t yet approved or cleared by the [place]  FDA and  has been authorized for detection and/or diagnosis of SARS-CoV-2 by FDA under an Emergency Use Authorization (EUA). This EUA will remain  in effect (meaning this test can be used) for the duration  of the COVID-19 declaration under Section 564(b)(1) of the Act, 21 U.S.C.section 360bbb-3(b)(1), unless the authorization is terminated  or revoked sooner.       Influenza A by PCR NEGATIVE NEGATIVE Final   Influenza B by PCR NEGATIVE NEGATIVE Final    Comment: (NOTE) The Xpert Xpress SARS-CoV-2/FLU/RSV plus assay is intended as an aid in the diagnosis of influenza from Nasopharyngeal swab specimens and should not be used as a sole basis for treatment. Nasal washings and aspirates are unacceptable for Xpert Xpress SARS-CoV-2/FLU/RSV testing.  Fact Sheet for Patients: BloggerCourse.com  Fact Sheet for Healthcare Providers: SeriousBroker.it  This test is not yet approved or cleared by the [place]  FDA and has been authorized for detection and/or diagnosis of SARS-CoV-2 by FDA under an Emergency Use Authorization (EUA). This EUA will remain in effect (meaning this test can be used) for the duration of the COVID-19 declaration under Section 564(b)(1) of the Act, 21 U.S.C. section 360bbb-3(b)(1), unless the authorization is terminated or revoked.     Resp Syncytial Virus by PCR NEGATIVE NEGATIVE Final    Comment: (NOTE) Fact Sheet for Patients: BloggerCourse.com  Fact  Sheet for Healthcare Providers: SeriousBroker.it  This test is not yet approved or cleared by the [place]  FDA and has been authorized for detection and/or diagnosis of SARS-CoV-2 by FDA under an Emergency Use Authorization (EUA). This EUA will remain in effect (meaning this test can be used) for the duration of the COVID-19 declaration under Section 564(b)(1) of the Act, 21 U.S.C. section 360bbb-3(b)(1), unless the authorization is terminated or revoked.  Performed at [facility], [address], [place], [place] [address]   Culture, blood (routine x 2) Call MD if unable to obtain prior to antibiotics  being given     Status: None (Preliminary result)   Collection Time: 03/04/24 10:53 PM   Specimen: BLOOD RIGHT HAND  Result Value Ref Range Status   Specimen Description BLOOD RIGHT HAND  Final   Special Requests   Final    BOTTLES DRAWN AEROBIC AND ANAEROBIC Blood Culture adequate volume   Culture   Final    NO GROWTH 3 DAYS Performed at [facility], [address] [place], [place] [address]    Report Status PENDING  Incomplete  Culture, blood (routine x 2) Call MD if unable to obtain prior to antibiotics being given     Status: None (Preliminary result)   Collection Time: 03/04/24 11:00 PM   Specimen: BLOOD RIGHT HAND  Result Value Ref Range Status   Specimen Description BLOOD RIGHT HAND  Final   Special Requests   Final    BOTTLES DRAWN AEROBIC AND ANAEROBIC Blood Culture adequate volume   Culture   Final    NO GROWTH 3 DAYS Performed at [facility], [address], [place], [place] [address]    Report Status PENDING  Incomplete     Time coordinating discharge: 35 minutes  SIGNED:   Cornelius Dill, DO [facility] 03/07/2024, 4:10 PM  If 7PM-7AM, please contact night-coverage [email or web address]

## 2024-03-07 NOTE — Care Management Important Message (Signed)
 Important Message  Patient Details  Name: ALBINO BUFFORD MRN: [id number] Date of Birth: 1956/11/16   Important Message Given:  N/A - LOS <3 / Initial given by admissions     Neila Bally 03/07/2024, 4:17 PM

## 2024-03-07 NOTE — TOC Transition Note (Signed)
 Transition of Care [facility]) - Discharge Note   Patient Details  Name: Peter Levy MRN: [id number] Date of Birth: 1956-04-28  Transition of Care [facility]) CM/SW Contact:  Ander Katos, LCSW Phone Number: 03/07/2024, 4:01 PM   Clinical Narrative: Pt d/c today. Facility to pick up pt. D/C summary provided. Cory with [facility] notified.       Final next level of care: Group Home Barriers to Discharge: Barriers Resolved   Patient Goals and CMS Choice [name]   Choice offered to / list presented to : Patient [place] ownership interest in [facility]provided to::  (n/a)    Discharge Placement                Patient to be transferred to facility by: facility van   Patient and family notified of of transfer: 03/07/24  Discharge Plan and Services Additional resources added to the After Visit Summary for   In-house Referral: Clinical Social Work                        HH Arranged: PT HH Agency: [facility] Date [facility] Agency Contacted: 03/07/24 Time HH Agency Contacted: [phone] Representative spoke with at [facility] Agency: Randel Buss  Social Drivers of Health (SDOH) Interventions SDOH Screenings   Food Insecurity: No Food Insecurity (03/04/2024)  Housing: Low Risk  (03/04/2024)  Transportation Needs: No Transportation Needs (03/04/2024)  Utilities: Not At Risk (03/04/2024)  Social Connections: Unknown (03/04/2024)  Recent Concern: Social Connections - Socially Isolated (02/22/2024)  Tobacco Use: Low Risk  (03/07/2024)     Readmission Risk Interventions     No data to display

## 2024-03-07 NOTE — Plan of Care (Signed)

## 2024-03-08 DIAGNOSIS — J181 Lobar pneumonia, unspecified organism: Secondary | ICD-10-CM | POA: Diagnosis not present

## 2024-03-08 DIAGNOSIS — J90 Pleural effusion, not elsewhere classified: Secondary | ICD-10-CM | POA: Diagnosis not present

## 2024-03-08 DIAGNOSIS — D649 Anemia, unspecified: Secondary | ICD-10-CM | POA: Diagnosis not present

## 2024-03-08 DIAGNOSIS — J9811 Atelectasis: Secondary | ICD-10-CM | POA: Diagnosis not present

## 2024-03-08 LAB — PATHOLOGIST SMEAR REVIEW

## 2024-03-09 DIAGNOSIS — J181 Lobar pneumonia, unspecified organism: Secondary | ICD-10-CM | POA: Diagnosis not present

## 2024-03-09 DIAGNOSIS — J90 Pleural effusion, not elsewhere classified: Secondary | ICD-10-CM | POA: Diagnosis not present

## 2024-03-09 DIAGNOSIS — J9811 Atelectasis: Secondary | ICD-10-CM | POA: Diagnosis not present

## 2024-03-09 DIAGNOSIS — D649 Anemia, unspecified: Secondary | ICD-10-CM | POA: Diagnosis not present

## 2024-03-09 DIAGNOSIS — G809 Cerebral palsy, unspecified: Secondary | ICD-10-CM | POA: Diagnosis not present

## 2024-03-09 DIAGNOSIS — R1310 Dysphagia, unspecified: Secondary | ICD-10-CM | POA: Diagnosis not present

## 2024-03-09 LAB — CULTURE, BLOOD (ROUTINE X 2)
Special Requests: ADEQUATE
Special Requests: ADEQUATE

## 2024-03-11 DIAGNOSIS — Z6824 Body mass index (BMI) 24.0-24.9, adult: Secondary | ICD-10-CM | POA: Diagnosis not present

## 2024-03-11 DIAGNOSIS — D649 Anemia, unspecified: Secondary | ICD-10-CM | POA: Diagnosis not present

## 2024-03-11 DIAGNOSIS — E559 Vitamin D deficiency, unspecified: Secondary | ICD-10-CM | POA: Diagnosis not present

## 2024-03-11 DIAGNOSIS — J181 Lobar pneumonia, unspecified organism: Secondary | ICD-10-CM | POA: Diagnosis not present

## 2024-03-11 DIAGNOSIS — R140 Abdominal distension (gaseous): Secondary | ICD-10-CM | POA: Diagnosis not present

## 2024-03-11 DIAGNOSIS — J90 Pleural effusion, not elsewhere classified: Secondary | ICD-10-CM | POA: Diagnosis not present

## 2024-03-11 DIAGNOSIS — J069 Acute upper respiratory infection, unspecified: Secondary | ICD-10-CM | POA: Diagnosis not present

## 2024-03-11 DIAGNOSIS — J9811 Atelectasis: Secondary | ICD-10-CM | POA: Diagnosis not present

## 2024-03-11 DIAGNOSIS — J1289 Other viral pneumonia: Secondary | ICD-10-CM | POA: Diagnosis not present

## 2024-03-11 DIAGNOSIS — R0789 Other chest pain: Secondary | ICD-10-CM | POA: Diagnosis not present

## 2024-03-12 LAB — CULTURE, BODY FLUID W GRAM STAIN -BOTTLE: Culture: NO GROWTH

## 2024-03-12 LAB — AEROBIC/ANAEROBIC CULTURE W GRAM STAIN (SURGICAL/DEEP WOUND)
Culture: NO GROWTH
Gram Stain: NONE SEEN

## 2024-03-16 DIAGNOSIS — J90 Pleural effusion, not elsewhere classified: Secondary | ICD-10-CM | POA: Diagnosis not present

## 2024-03-16 DIAGNOSIS — J181 Lobar pneumonia, unspecified organism: Secondary | ICD-10-CM | POA: Diagnosis not present

## 2024-03-16 DIAGNOSIS — J9811 Atelectasis: Secondary | ICD-10-CM | POA: Diagnosis not present

## 2024-03-16 DIAGNOSIS — R1310 Dysphagia, unspecified: Secondary | ICD-10-CM | POA: Diagnosis not present

## 2024-03-16 DIAGNOSIS — D649 Anemia, unspecified: Secondary | ICD-10-CM | POA: Diagnosis not present

## 2024-03-16 DIAGNOSIS — G809 Cerebral palsy, unspecified: Secondary | ICD-10-CM | POA: Diagnosis not present

## 2024-03-18 DIAGNOSIS — D649 Anemia, unspecified: Secondary | ICD-10-CM | POA: Diagnosis not present

## 2024-03-18 DIAGNOSIS — J9811 Atelectasis: Secondary | ICD-10-CM | POA: Diagnosis not present

## 2024-03-18 DIAGNOSIS — G809 Cerebral palsy, unspecified: Secondary | ICD-10-CM | POA: Diagnosis not present

## 2024-03-18 DIAGNOSIS — J90 Pleural effusion, not elsewhere classified: Secondary | ICD-10-CM | POA: Diagnosis not present

## 2024-03-18 DIAGNOSIS — J181 Lobar pneumonia, unspecified organism: Secondary | ICD-10-CM | POA: Diagnosis not present

## 2024-03-18 DIAGNOSIS — R1310 Dysphagia, unspecified: Secondary | ICD-10-CM | POA: Diagnosis not present

## 2024-03-22 DIAGNOSIS — D649 Anemia, unspecified: Secondary | ICD-10-CM | POA: Diagnosis not present

## 2024-03-22 DIAGNOSIS — J9811 Atelectasis: Secondary | ICD-10-CM | POA: Diagnosis not present

## 2024-03-22 DIAGNOSIS — J90 Pleural effusion, not elsewhere classified: Secondary | ICD-10-CM | POA: Diagnosis not present

## 2024-03-22 DIAGNOSIS — J181 Lobar pneumonia, unspecified organism: Secondary | ICD-10-CM | POA: Diagnosis not present

## 2024-03-22 DIAGNOSIS — R1310 Dysphagia, unspecified: Secondary | ICD-10-CM | POA: Diagnosis not present

## 2024-03-22 DIAGNOSIS — G809 Cerebral palsy, unspecified: Secondary | ICD-10-CM | POA: Diagnosis not present

## 2024-03-25 DIAGNOSIS — D649 Anemia, unspecified: Secondary | ICD-10-CM | POA: Diagnosis not present

## 2024-03-25 DIAGNOSIS — G809 Cerebral palsy, unspecified: Secondary | ICD-10-CM | POA: Diagnosis not present

## 2024-03-25 DIAGNOSIS — J90 Pleural effusion, not elsewhere classified: Secondary | ICD-10-CM | POA: Diagnosis not present

## 2024-03-25 DIAGNOSIS — J181 Lobar pneumonia, unspecified organism: Secondary | ICD-10-CM | POA: Diagnosis not present

## 2024-03-25 DIAGNOSIS — J9811 Atelectasis: Secondary | ICD-10-CM | POA: Diagnosis not present

## 2024-03-25 DIAGNOSIS — R1310 Dysphagia, unspecified: Secondary | ICD-10-CM | POA: Diagnosis not present

## 2024-03-28 DIAGNOSIS — M79672 Pain in left foot: Secondary | ICD-10-CM | POA: Diagnosis not present

## 2024-03-28 DIAGNOSIS — I739 Peripheral vascular disease, unspecified: Secondary | ICD-10-CM | POA: Diagnosis not present

## 2024-03-28 DIAGNOSIS — M79671 Pain in right foot: Secondary | ICD-10-CM | POA: Diagnosis not present

## 2024-03-28 DIAGNOSIS — L110 Acquired keratosis follicularis: Secondary | ICD-10-CM | POA: Diagnosis not present

## 2024-03-29 DIAGNOSIS — J181 Lobar pneumonia, unspecified organism: Secondary | ICD-10-CM | POA: Diagnosis not present

## 2024-03-29 DIAGNOSIS — J9811 Atelectasis: Secondary | ICD-10-CM | POA: Diagnosis not present

## 2024-03-29 DIAGNOSIS — R1310 Dysphagia, unspecified: Secondary | ICD-10-CM | POA: Diagnosis not present

## 2024-03-29 DIAGNOSIS — G809 Cerebral palsy, unspecified: Secondary | ICD-10-CM | POA: Diagnosis not present

## 2024-03-29 DIAGNOSIS — D649 Anemia, unspecified: Secondary | ICD-10-CM | POA: Diagnosis not present

## 2024-03-29 DIAGNOSIS — J90 Pleural effusion, not elsewhere classified: Secondary | ICD-10-CM | POA: Diagnosis not present

## 2024-03-30 DIAGNOSIS — J9811 Atelectasis: Secondary | ICD-10-CM | POA: Diagnosis not present

## 2024-03-30 DIAGNOSIS — G809 Cerebral palsy, unspecified: Secondary | ICD-10-CM | POA: Diagnosis not present

## 2024-03-30 DIAGNOSIS — R1310 Dysphagia, unspecified: Secondary | ICD-10-CM | POA: Diagnosis not present

## 2024-03-30 DIAGNOSIS — D649 Anemia, unspecified: Secondary | ICD-10-CM | POA: Diagnosis not present

## 2024-03-30 DIAGNOSIS — R911 Solitary pulmonary nodule: Secondary | ICD-10-CM | POA: Diagnosis not present

## 2024-03-30 DIAGNOSIS — Z9181 History of falling: Secondary | ICD-10-CM | POA: Diagnosis not present

## 2024-03-30 DIAGNOSIS — K5981 Ogilvie syndrome: Secondary | ICD-10-CM | POA: Diagnosis not present

## 2024-03-30 DIAGNOSIS — J181 Lobar pneumonia, unspecified organism: Secondary | ICD-10-CM | POA: Diagnosis not present

## 2024-03-30 DIAGNOSIS — J9601 Acute respiratory failure with hypoxia: Secondary | ICD-10-CM | POA: Diagnosis not present

## 2024-03-30 DIAGNOSIS — J90 Pleural effusion, not elsewhere classified: Secondary | ICD-10-CM | POA: Diagnosis not present

## 2024-03-30 DIAGNOSIS — Z7951 Long term (current) use of inhaled steroids: Secondary | ICD-10-CM | POA: Diagnosis not present

## 2024-04-05 DIAGNOSIS — J90 Pleural effusion, not elsewhere classified: Secondary | ICD-10-CM | POA: Diagnosis not present

## 2024-04-05 DIAGNOSIS — K5981 Ogilvie syndrome: Secondary | ICD-10-CM | POA: Diagnosis not present

## 2024-04-05 DIAGNOSIS — J9601 Acute respiratory failure with hypoxia: Secondary | ICD-10-CM | POA: Diagnosis not present

## 2024-04-05 DIAGNOSIS — J9811 Atelectasis: Secondary | ICD-10-CM | POA: Diagnosis not present

## 2024-04-05 DIAGNOSIS — J181 Lobar pneumonia, unspecified organism: Secondary | ICD-10-CM | POA: Diagnosis not present

## 2024-04-05 DIAGNOSIS — D649 Anemia, unspecified: Secondary | ICD-10-CM | POA: Diagnosis not present

## 2024-04-11 DIAGNOSIS — J9601 Acute respiratory failure with hypoxia: Secondary | ICD-10-CM | POA: Diagnosis not present

## 2024-04-11 DIAGNOSIS — J9811 Atelectasis: Secondary | ICD-10-CM | POA: Diagnosis not present

## 2024-04-11 DIAGNOSIS — J181 Lobar pneumonia, unspecified organism: Secondary | ICD-10-CM | POA: Diagnosis not present

## 2024-04-11 DIAGNOSIS — J90 Pleural effusion, not elsewhere classified: Secondary | ICD-10-CM | POA: Diagnosis not present

## 2024-04-11 DIAGNOSIS — D649 Anemia, unspecified: Secondary | ICD-10-CM | POA: Diagnosis not present

## 2024-04-11 DIAGNOSIS — K5981 Ogilvie syndrome: Secondary | ICD-10-CM | POA: Diagnosis not present

## 2024-04-13 DIAGNOSIS — J189 Pneumonia, unspecified organism: Secondary | ICD-10-CM | POA: Diagnosis not present

## 2024-04-13 DIAGNOSIS — Z6824 Body mass index (BMI) 24.0-24.9, adult: Secondary | ICD-10-CM | POA: Diagnosis not present

## 2024-04-13 DIAGNOSIS — J181 Lobar pneumonia, unspecified organism: Secondary | ICD-10-CM | POA: Diagnosis not present

## 2024-06-06 DIAGNOSIS — M79672 Pain in left foot: Secondary | ICD-10-CM | POA: Diagnosis not present

## 2024-06-06 DIAGNOSIS — I739 Peripheral vascular disease, unspecified: Secondary | ICD-10-CM | POA: Diagnosis not present

## 2024-06-06 DIAGNOSIS — M79671 Pain in right foot: Secondary | ICD-10-CM | POA: Diagnosis not present

## 2024-06-06 DIAGNOSIS — L110 Acquired keratosis follicularis: Secondary | ICD-10-CM | POA: Diagnosis not present

## 2024-08-22 DIAGNOSIS — M79671 Pain in right foot: Secondary | ICD-10-CM | POA: Diagnosis not present

## 2024-08-22 DIAGNOSIS — L110 Acquired keratosis follicularis: Secondary | ICD-10-CM | POA: Diagnosis not present

## 2024-08-22 DIAGNOSIS — M79672 Pain in left foot: Secondary | ICD-10-CM | POA: Diagnosis not present

## 2024-08-22 DIAGNOSIS — I739 Peripheral vascular disease, unspecified: Secondary | ICD-10-CM | POA: Diagnosis not present

## 2024-09-27 DIAGNOSIS — Z23 Encounter for immunization: Secondary | ICD-10-CM | POA: Diagnosis not present

## 2024-09-27 DIAGNOSIS — Z0000 Encounter for general adult medical examination without abnormal findings: Secondary | ICD-10-CM | POA: Diagnosis not present

## 2024-09-27 DIAGNOSIS — E559 Vitamin D deficiency, unspecified: Secondary | ICD-10-CM | POA: Diagnosis not present

## 2024-09-27 DIAGNOSIS — G4700 Insomnia, unspecified: Secondary | ICD-10-CM | POA: Diagnosis not present

## 2024-09-27 DIAGNOSIS — R7309 Other abnormal glucose: Secondary | ICD-10-CM | POA: Diagnosis not present

## 2024-09-27 DIAGNOSIS — J449 Chronic obstructive pulmonary disease, unspecified: Secondary | ICD-10-CM | POA: Diagnosis not present

## 2024-09-27 DIAGNOSIS — E785 Hyperlipidemia, unspecified: Secondary | ICD-10-CM | POA: Diagnosis not present

## 2024-09-27 DIAGNOSIS — Z1331 Encounter for screening for depression: Secondary | ICD-10-CM | POA: Diagnosis not present

## 2024-09-27 DIAGNOSIS — G809 Cerebral palsy, unspecified: Secondary | ICD-10-CM | POA: Diagnosis not present

## 2024-09-27 DIAGNOSIS — Z125 Encounter for screening for malignant neoplasm of prostate: Secondary | ICD-10-CM | POA: Diagnosis not present

## 2024-09-27 DIAGNOSIS — E611 Iron deficiency: Secondary | ICD-10-CM | POA: Diagnosis not present

## 2024-09-30 ENCOUNTER — Encounter (HOSPITAL_COMMUNITY): Payer: Self-pay | Admitting: Emergency Medicine

## 2024-09-30 ENCOUNTER — Emergency Department (HOSPITAL_COMMUNITY)

## 2024-09-30 ENCOUNTER — Other Ambulatory Visit: Payer: Self-pay

## 2024-09-30 ENCOUNTER — Emergency Department (HOSPITAL_COMMUNITY)
Admission: EM | Admit: 2024-09-30 | Discharge: 2024-09-30 | Disposition: A | Discharge status: Home / Self Care | Attending: Emergency Medicine | Admitting: Emergency Medicine

## 2024-09-30 DIAGNOSIS — M4802 Spinal stenosis, cervical region: Secondary | ICD-10-CM | POA: Diagnosis not present

## 2024-09-30 DIAGNOSIS — S0081XA Abrasion of other part of head, initial encounter: Secondary | ICD-10-CM | POA: Insufficient documentation

## 2024-09-30 DIAGNOSIS — R0602 Shortness of breath: Secondary | ICD-10-CM | POA: Insufficient documentation

## 2024-09-30 DIAGNOSIS — W01198A Fall on same level from slipping, tripping and stumbling with subsequent striking against other object, initial encounter: Secondary | ICD-10-CM | POA: Insufficient documentation

## 2024-09-30 DIAGNOSIS — M25552 Pain in left hip: Secondary | ICD-10-CM | POA: Diagnosis not present

## 2024-09-30 DIAGNOSIS — M549 Dorsalgia, unspecified: Secondary | ICD-10-CM | POA: Diagnosis not present

## 2024-09-30 DIAGNOSIS — M545 Low back pain, unspecified: Secondary | ICD-10-CM | POA: Diagnosis not present

## 2024-09-30 DIAGNOSIS — S199XXA Unspecified injury of neck, initial encounter: Secondary | ICD-10-CM | POA: Diagnosis not present

## 2024-09-30 DIAGNOSIS — M47812 Spondylosis without myelopathy or radiculopathy, cervical region: Secondary | ICD-10-CM | POA: Diagnosis not present

## 2024-09-30 DIAGNOSIS — S0990XA Unspecified injury of head, initial encounter: Secondary | ICD-10-CM | POA: Diagnosis not present

## 2024-09-30 MED ORDER — OXYCODONE-ACETAMINOPHEN 5-325 MG PO TABS
1.0000 | ORAL_TABLET | Freq: Once | ORAL | Status: AC
  Administered 2024-09-30: 1 via ORAL
  Filled 2024-09-30: qty 1

## 2024-09-30 NOTE — ED Notes (Signed)
 Patient transported to CT

## 2024-09-30 NOTE — ED Triage Notes (Signed)
 Pt arrived to ED via POV c/o mechanical fall about 1 hr PTA. Pt states he fell forward hitting his head on the wood floor. Pt denies any other injuries.

## 2024-09-30 NOTE — ED Provider Notes (Signed)
 [place] EMERGENCY DEPARTMENT AT [facility] Provider Note  CSN: [id number] Arrival date & time: 09/30/24 1856  Chief Complaint(s) Fall  HPI Peter Levy is a 68 y.o. male with cerebral palsy presenting to the emergency department with fall.  Patient uses a walker, says the walker gave out from him and he landed forward, hitting his head on the floor.  Denies any nausea or vomiting.  Reports initially had a mild headache that is resolved.  Reports that he also had some pain in the low back and left hip but has been able to get up and walk a bit.  Denies any pain in the arms.  Denies any pain in the right lower extremity.  Denies loss of consciousness or any blood thinner use.  Denies any neck or upper back pain.  Denies any chest pain or trouble breathing.   Past Medical History Past Medical History:  Diagnosis Date   CP (cerebral palsy) (HCC)    Patient Active Problem List   Diagnosis Date Noted   Pseudo-obstruction of colon 03/04/2024   Pleural effusion 03/04/2024   Normocytic anemia 02/23/2024   Multifocal pneumonia 02/22/2024   Rhabdomyolysis 08/27/2021   Cerebral palsy (HCC) 08/27/2021   Special screening for malignant neoplasms, colon    Failed attempted surgical procedure    Home Medication(s) Prior to Admission medications   Medication Sig Start Date End Date Taking? Authorizing Provider  albuterol  (VENTOLIN  HFA) 108 (90 Base) MCG/ACT inhaler Inhale 2 puffs into the lungs every 4 (four) hours as needed for wheezing or shortness of breath. 08/28/21   Pearlean Manus, MD  benzonatate  (TESSALON ) 200 MG capsule Take 1 capsule (200 mg total) by mouth 3 (three) times daily as needed for cough. 03/07/24   Maree, Pratik D, DO  BREZTRI AEROSPHERE 160-9-4.8 MCG/ACT AERO Inhale 2 puffs into the lungs in the morning and at bedtime. 09/29/22   [provider]  busPIRone  (BUSPAR ) 5 MG tablet Take 1 tablet (5 mg total) by mouth 2 (two) times daily. 03/07/24   Maree,  Pratik D, DO  Cholecalciferol (VITAMIN D3) 50 MCG (2000 UT) capsule Take 2,000 Units by mouth daily.    [provider]  cyclobenzaprine  (FLEXERIL ) 10 MG tablet Take 1 tablet (10 mg total) by mouth 3 (three) times daily. 03/07/24   Maree, Pratik D, DO  GAS RELIEF 125 MG CAPS Take 1 capsule by mouth daily as needed (gas). 01/15/24   [provider]  RUDOLM PARKINS 17 GM/SCOOP powder Take 17 g by mouth daily. 01/15/24   [provider]  guaiFENesin -dextromethorphan  (ROBITUSSIN DM) 100-10 MG/5ML syrup Take 5 mLs by mouth every 4 (four) hours as needed for cough. 03/07/24   Maree, Pratik D, DO  polyethylene glycol (MIRALAX  / GLYCOLAX ) 17 g packet Take 1 packet by mouth daily. 02/15/24   [provider]  Past Surgical History Past Surgical History:  Procedure Laterality Date   COLONOSCOPY     FLEXIBLE SIGMOIDOSCOPY N/A 05/14/2018   Procedure: FLEXIBLE SIGMOIDOSCOPY;  Surgeon: Mavis Anes, MD;  Location: [facility];  Service: Gastroenterology;  Laterality: N/A;   none     none   Family History History reviewed. No pertinent family history.  Social History Social History   Tobacco Use   Smoking status: Never   Smokeless tobacco: Never  Vaping Use   Vaping status: Never Used  Substance Use Topics   Alcohol use: No   Drug use: No   Allergies Patient has no known allergies.  Review of Systems Review of Systems  All other systems reviewed and are negative.   Physical Exam Vital Signs  I have reviewed the triage vital signs BP 139/78   Pulse (!) 113   Temp (!) 97.2 F (36.2 C)   Resp 18   Ht 5' 5 (1.651 m)   Wt 71.4 kg   SpO2 95%   BMI 26.19 kg/m  Physical Exam Vitals and nursing note reviewed.  Constitutional:      General: He is not in acute distress.    Appearance: Normal appearance.  HENT:     Head:  Normocephalic.     Comments: Mild abrasion to the right forehead    Mouth/Throat:     Mouth: Mucous membranes are moist.  Eyes:     Conjunctiva/sclera: Conjunctivae normal.  Cardiovascular:     Rate and Rhythm: Normal rate and regular rhythm.  Pulmonary:     Effort: Pulmonary effort is normal. No respiratory distress.     Breath sounds: Normal breath sounds.  Abdominal:     General: Abdomen is flat.     Palpations: Abdomen is soft.     Tenderness: There is no abdominal tenderness.  Musculoskeletal:     Right lower leg: No edema.     Left lower leg: No edema.     Comments: No midline C, T, L-spine tenderness.  Mild lumbar paraspinal tenderness.  Full range of motion of the bilateral upper and lower extremities without focal tenderness or deformity.  Skin:    General: Skin is warm and dry.     Capillary Refill: Capillary refill takes less than 2 seconds.  Neurological:     Mental Status: He is alert and oriented to person, place, and time. Mental status is at baseline.  Psychiatric:        Mood and Affect: Mood normal.        Behavior: Behavior normal.     ED Results and Treatments Labs (all labs ordered are listed, but only abnormal results are displayed) Labs Reviewed - No data to display                                                                                                                        Radiology CT Lumbar Spine Wo Contrast Result Date: 09/30/2024 CLINICAL DATA:  Back pain history of  fall EXAM: CT LUMBAR SPINE WITHOUT CONTRAST TECHNIQUE: Multidetector CT imaging of the lumbar spine was performed without intravenous contrast administration. Multiplanar CT image reconstructions were also generated. RADIATION DOSE REDUCTION: This exam was performed according to the departmental dose-optimization program which includes automated exposure control, adjustment of the mA and/or kV according to patient size and/or use of iterative reconstruction technique. COMPARISON:   CT 03/04/2024 FINDINGS: Segmentation: 5 lumbar type vertebrae. Alignment: Grossly normal but motion degraded study. Vertebrae: No obvious fracture but limited particularly at L5 and the upper sacrum due to motion Paraspinal and other soft tissues: No obvious acute abnormality. Disc levels: Disc space narrowing L2-L3 and L4-L5. No high-grade canal stenosis. IMPRESSION: Motion degraded study. No obvious acute osseous abnormality. Limited assessment for fracture particularly at L5 and the upper sacrum due to motion. Electronically Signed   By: Luke Bun M.D.   On: 09/30/2024 20:59   CT Cervical Spine Wo Contrast Result Date: 09/30/2024 CLINICAL DATA:  Trauma fall EXAM: CT CERVICAL SPINE WITHOUT CONTRAST TECHNIQUE: Multidetector CT imaging of the cervical spine was performed without intravenous contrast. Multiplanar CT image reconstructions were also generated. RADIATION DOSE REDUCTION: This exam was performed according to the departmental dose-optimization program which includes automated exposure control, adjustment of the mA and/or kV according to patient size and/or use of iterative reconstruction technique. COMPARISON:  CT 03/04/2015 FINDINGS: Alignment: Mild motion degradation. Straightening of the cervical spine. No subluxation. Facet alignment within normal limits. Slight increased anterior atlantoaxial distance, sagittal series 14, image 41, measuring up to 4 mm. Skull base and vertebrae: No acute fracture. No primary bone lesion or focal pathologic process. Soft tissues and spinal canal: No prevertebral fluid or swelling. No visible canal hematoma. Disc levels: Moderate severe diffuse disc space narrowing throughout the cervical spine. Facet degenerative changes at multiple levels. Multilevel foraminal narrowing. Upper chest: Negative. Other: None66 IMPRESSION: 1. Motion degradation limits the exam. No definite fracture is seen. 2. Slight increased anterior atlantoaxial distance, measuring up to 4 mm,  and appears increased compared with remote CT from 2016. This could be secondary to progressive C1-C2 degenerative changes though ligamentous injury not entirely excluded in the setting of trauma. Follow-up MRI could be obtained depending upon the level of clinical suspicion. 3. Multilevel degenerative changes. Electronically Signed   By: Luke Bun M.D.   On: 09/30/2024 20:56   DG Hip Unilat W or Wo Pelvis 2-3 Views Left Result Date: 09/30/2024 EXAM: 2 or more VIEW(S) XRAY OF THE UNILATERAL HIP 09/30/2024 08:43:00 PM COMPARISON: X-ray pelvis 04/23/2005. CLINICAL HISTORY: fall FINDINGS: BONES AND JOINTS: Limited evaluation due to overlapping osseous structures. No acute fracture or focal osseous lesion. The hip joint is maintained. No significant degenerative changes. SOFT TISSUES: Limited evaluation due to overlying soft tissues. The soft tissues are unremarkable. IMPRESSION: 1. No acute fracture or dislocation. Electronically signed by: Morgane Naveau MD 09/30/2024 08:49 PM EST RP Workstation: [id number]   CT Head Wo Contrast Result Date: 09/30/2024 CLINICAL DATA:  Head trauma mechanical fall EXAM: CT HEAD WITHOUT CONTRAST TECHNIQUE: Contiguous axial images were obtained from the base of the skull through the vertex without intravenous contrast. RADIATION DOSE REDUCTION: This exam was performed according to the departmental dose-optimization program which includes automated exposure control, adjustment of the mA and/or kV according to patient size and/or use of iterative reconstruction technique. COMPARISON:  None Available. FINDINGS: Brain: Motion degradation limits the exam. No hemorrhage or mass. No ventricular enlargement. Vascular: No unexpected calcification. Mild carotid vascular calcification Skull: No  depressed fracture Sinuses/Orbits: No acute finding. Other: None IMPRESSION: Motion degradation limits the exam. No definite acute intracranial abnormality. Electronically Signed   By: Luke Bun M.D.   On: 09/30/2024 20:41    Pertinent labs & imaging results that were available during my care of the patient were reviewed by me and considered in my medical decision making (see MDM for details).  Medications Ordered in ED Medications  oxyCODONE-acetaminophen  (PERCOCET/ROXICET) 5-325 MG per tablet 1 tablet (1 tablet Oral Given 09/30/24 2042)                                                                                                                                     Procedures Procedures  (including critical care time)  Medical Decision Making / ED Course   MDM:  68 year old presenting to the emergency department shortness of breath.  Patient overall well-appearing, physical examination with mild abrasion of the forehead, otherwise some lumbar paraspinal tenderness without midline tenderness.  Imaging obtained of the head and neck, lumbar spine and left hip without evidence of acute injury.  No evidence of any other underlying injury.  Patient is overall well-appearing.  CT neck showing possible degenerative changes versus acute injury but patient has absolutely no neck pain and full range of motion of his neck so concern for acute injury is low.  Feel patient is stable for discharge to home. Will discharge patient to home. All questions answered. Patient comfortable with plan of discharge. Return precautions discussed with patient and specified on the after visit summary.       Additional history obtained: -Additional history obtained from family -External records from outside source obtained and reviewed including: Chart review including previous notes, labs, imaging, consultation notes including prior notes     Imaging Studies ordered: I ordered imaging studies including CT scans On my interpretation imaging demonstrates no acute injury  I independently visualized and interpreted imaging. I agree with the radiologist interpretation   Medicines ordered  and prescription drug management: Meds ordered this encounter  Medications   oxyCODONE-acetaminophen  (PERCOCET/ROXICET) 5-325 MG per tablet 1 tablet    Refill:  0    -I have reviewed the patients home medicines and have made adjustments as needed  Reevaluation: After the interventions noted above, I reevaluated the patient and found that their symptoms have improved  Co morbidities that complicate the patient evaluation  Past Medical History:  Diagnosis Date   CP (cerebral palsy) (HCC)       Dispostion: Disposition decision including need for hospitalization was considered, and patient discharged from emergency department.    Final Clinical Impression(s) / ED Diagnoses Final diagnoses:  Minor head injury, initial encounter     This chart was dictated using voice recognition software.  Despite best efforts to proofread,  errors can occur which can change the documentation meaning.    Francesca Elsie CROME, MD 09/30/24 2125

## 2024-09-30 NOTE — Discharge Instructions (Signed)
 We evaluated you in the emergency department for your fall.  Your testing in the emergency department is reassuring including CT scans and x-rays.  We did not see signs of any dangerous injury.  Please take 1000 mg of Tylenol  every 6 hours as needed for pain.  You can also apply ice to areas of soreness.  Please follow-up closely with your primary doctor.  If you have any new or worsening symptoms please return to the emergency department.
